# Patient Record
Sex: Female | Born: 1950
Health system: Southern US, Community
[De-identification: ages and names within clinical notes are randomized; demographics above are authoritative.]

## PROBLEM LIST (undated history)

## (undated) DIAGNOSIS — M1990 Unspecified osteoarthritis, unspecified site: Secondary | ICD-10-CM

## (undated) DIAGNOSIS — M2550 Pain in unspecified joint: Secondary | ICD-10-CM

## (undated) DIAGNOSIS — Z8719 Personal history of other diseases of the digestive system: Secondary | ICD-10-CM

## (undated) DIAGNOSIS — R51 Headache: Secondary | ICD-10-CM

## (undated) DIAGNOSIS — T7840XA Allergy, unspecified, initial encounter: Secondary | ICD-10-CM

## (undated) DIAGNOSIS — J302 Other seasonal allergic rhinitis: Secondary | ICD-10-CM

## (undated) DIAGNOSIS — E785 Hyperlipidemia, unspecified: Secondary | ICD-10-CM

## (undated) DIAGNOSIS — K219 Gastro-esophageal reflux disease without esophagitis: Secondary | ICD-10-CM

## (undated) DIAGNOSIS — B019 Varicella without complication: Secondary | ICD-10-CM

## (undated) DIAGNOSIS — T8859XA Other complications of anesthesia, initial encounter: Secondary | ICD-10-CM

## (undated) DIAGNOSIS — T4145XA Adverse effect of unspecified anesthetic, initial encounter: Secondary | ICD-10-CM

## (undated) DIAGNOSIS — M19011 Primary osteoarthritis, right shoulder: Secondary | ICD-10-CM

## (undated) HISTORY — DX: Unspecified osteoarthritis, unspecified site: M19.90

## (undated) HISTORY — DX: Varicella without complication: B01.9

## (undated) HISTORY — DX: Pain in unspecified joint: M25.50

## (undated) HISTORY — DX: Gastro-esophageal reflux disease without esophagitis: K21.9

## (undated) HISTORY — PX: LIPOSUCTION: SHX10

## (undated) HISTORY — DX: Allergy, unspecified, initial encounter: T78.40XA

## (undated) HISTORY — DX: Hyperlipidemia, unspecified: E78.5

## (undated) HISTORY — PX: OTHER SURGICAL HISTORY: SHX169

---

## 1979-12-29 HISTORY — PX: OTHER SURGICAL HISTORY: SHX169

## 2000-09-21 ENCOUNTER — Other Ambulatory Visit: Admission: RE | Admit: 2000-09-21 | Discharge: 2000-09-21 | Payer: Self-pay | Admitting: Obstetrics and Gynecology

## 2001-09-27 ENCOUNTER — Other Ambulatory Visit: Admission: RE | Admit: 2001-09-27 | Discharge: 2001-09-27 | Payer: Self-pay | Admitting: Obstetrics and Gynecology

## 2001-10-10 ENCOUNTER — Encounter: Payer: Self-pay | Admitting: Obstetrics and Gynecology

## 2001-10-10 ENCOUNTER — Encounter: Admission: RE | Admit: 2001-10-10 | Discharge: 2001-10-10 | Payer: Self-pay | Admitting: Obstetrics and Gynecology

## 2003-03-01 ENCOUNTER — Other Ambulatory Visit: Admission: RE | Admit: 2003-03-01 | Discharge: 2003-03-01 | Payer: Self-pay | Admitting: Family Medicine

## 2003-10-03 ENCOUNTER — Encounter: Payer: Self-pay | Admitting: Family Medicine

## 2003-10-03 ENCOUNTER — Encounter: Admission: RE | Admit: 2003-10-03 | Discharge: 2003-10-03 | Payer: Self-pay | Admitting: Family Medicine

## 2003-10-08 ENCOUNTER — Encounter: Payer: Self-pay | Admitting: Family Medicine

## 2003-10-08 ENCOUNTER — Encounter: Admission: RE | Admit: 2003-10-08 | Discharge: 2003-10-08 | Payer: Self-pay | Admitting: Family Medicine

## 2004-10-16 ENCOUNTER — Other Ambulatory Visit: Admission: RE | Admit: 2004-10-16 | Discharge: 2004-10-16 | Payer: Self-pay | Admitting: Family Medicine

## 2005-12-28 HISTORY — PX: LIPOSUCTION: SHX10

## 2006-02-15 ENCOUNTER — Other Ambulatory Visit: Admission: RE | Admit: 2006-02-15 | Discharge: 2006-02-15 | Payer: Self-pay | Admitting: Family Medicine

## 2006-03-30 ENCOUNTER — Encounter: Admission: RE | Admit: 2006-03-30 | Discharge: 2006-03-30 | Payer: Self-pay | Admitting: Family Medicine

## 2007-04-18 ENCOUNTER — Other Ambulatory Visit: Admission: RE | Admit: 2007-04-18 | Discharge: 2007-04-18 | Payer: Self-pay | Admitting: Family Medicine

## 2010-03-18 ENCOUNTER — Other Ambulatory Visit: Admission: RE | Admit: 2010-03-18 | Discharge: 2010-03-18 | Payer: Self-pay | Admitting: *Deleted

## 2012-11-14 ENCOUNTER — Other Ambulatory Visit: Payer: Self-pay | Admitting: Orthopedic Surgery

## 2012-11-21 ENCOUNTER — Other Ambulatory Visit (HOSPITAL_COMMUNITY): Payer: Self-pay

## 2012-12-12 ENCOUNTER — Ambulatory Visit (HOSPITAL_COMMUNITY)
Admission: RE | Admit: 2012-12-12 | Discharge: 2012-12-12 | Disposition: A | Discharge status: Home / Self Care | Payer: BC Managed Care – PPO | Source: Ambulatory Visit | Attending: Orthopedic Surgery | Admitting: Orthopedic Surgery

## 2012-12-12 ENCOUNTER — Encounter (HOSPITAL_COMMUNITY): Payer: Self-pay

## 2012-12-12 ENCOUNTER — Encounter (HOSPITAL_COMMUNITY)
Admission: RE | Admit: 2012-12-12 | Discharge: 2012-12-12 | Disposition: A | Discharge status: Home / Self Care | Payer: BC Managed Care – PPO | Source: Ambulatory Visit | Attending: Orthopedic Surgery | Admitting: Orthopedic Surgery

## 2012-12-12 DIAGNOSIS — M19019 Primary osteoarthritis, unspecified shoulder: Secondary | ICD-10-CM | POA: Insufficient documentation

## 2012-12-12 DIAGNOSIS — Z01810 Encounter for preprocedural cardiovascular examination: Secondary | ICD-10-CM | POA: Insufficient documentation

## 2012-12-12 DIAGNOSIS — Z01818 Encounter for other preprocedural examination: Secondary | ICD-10-CM | POA: Insufficient documentation

## 2012-12-12 HISTORY — DX: Headache: R51

## 2012-12-12 HISTORY — DX: Unspecified osteoarthritis, unspecified site: M19.90

## 2012-12-12 HISTORY — DX: Other seasonal allergic rhinitis: J30.2

## 2012-12-12 HISTORY — DX: Other complications of anesthesia, initial encounter: T88.59XA

## 2012-12-12 HISTORY — DX: Adverse effect of unspecified anesthetic, initial encounter: T41.45XA

## 2012-12-12 LAB — BASIC METABOLIC PANEL
CO2: 27 mEq/L (ref 19–32)
Calcium: 9.6 mg/dL (ref 8.4–10.5)
Creatinine, Ser: 0.84 mg/dL (ref 0.50–1.10)
GFR calc Af Amer: 86 mL/min — ABNORMAL LOW (ref >90)

## 2012-12-12 LAB — CBC
MCHC: 33 g/dL (ref 30.0–36.0)
MCV: 87.4 fL (ref 78.0–100.0)
Platelets: 181 10*3/uL (ref 150–400)
RDW: 14.6 % (ref 11.5–15.5)
WBC: 7.3 10*3/uL (ref 4.0–10.5)

## 2012-12-12 LAB — URINALYSIS, ROUTINE W REFLEX MICROSCOPIC
Glucose, UA: NEGATIVE mg/dL
Hgb urine dipstick: NEGATIVE
Protein, ur: NEGATIVE mg/dL
pH: 7 (ref 5.0–8.0)

## 2012-12-12 LAB — SURGICAL PCR SCREEN
MRSA, PCR: NEGATIVE
Staphylococcus aureus: NEGATIVE

## 2012-12-12 LAB — TYPE AND SCREEN: ABO/RH(D): B POS

## 2012-12-12 LAB — ABO/RH: ABO/RH(D): B POS

## 2012-12-12 NOTE — Pre-Procedure Instructions (Signed)
20 GOLDA ZAVALZA  12/12/2012   Your procedure is scheduled on:  Monday, December 23rd.  Report to [facility] at 1100 AM.  Call this number if you have problems the morning of surgery: [phone]   Remember: Nothing to eat or drink after Midnight.      Take these medicines the morning of surgery with A SIP OF WATER: Flexeril.  Flovent if needed and bring to the hospital.   Do not wear jewelry, make-up or nail polish.  Do not wear lotions, powders, or perfumes. You may wear deodorant.  Do not shave 48 hours prior to surgery. Men may shave face and neck.  Do not bring valuables to the hospital.  Contacts, dentures or bridgework may not be worn into surgery.  Leave suitcase in the car. After surgery it may be brought to your room.  For patients admitted to the hospital, checkout time is 11:00 AM the day of discharge.   Patients discharged the day of surgery will not be allowed to drive home.  Name and phone number of your driver: __________________    Special Instructions: Shower using CHG 2 nights before surgery and the night before surgery.  If you shower the day of surgery use CHG.  Use special wash - you have one bottle of CHG for all showers.  You should use approximately 1/3 of the bottle for each shower.   Please read over the following fact sheets that you were given: Pain Booklet, Coughing and Deep Breathing, Blood Transfusion Information and Surgical Site Infection Prevention

## 2012-12-18 MED ORDER — CEFAZOLIN SODIUM-DEXTROSE 2-3 GM-% IV SOLR
2.0000 g | INTRAVENOUS | Status: AC
  Administered 2012-12-19: 2 g via INTRAVENOUS
  Filled 2012-12-18: qty 50

## 2012-12-19 ENCOUNTER — Ambulatory Visit (HOSPITAL_COMMUNITY): Payer: BC Managed Care – PPO | Admitting: Anesthesiology

## 2012-12-19 ENCOUNTER — Encounter (HOSPITAL_COMMUNITY)
Admission: RE | Disposition: A | Discharge status: Home / Self Care | Payer: Self-pay | Source: Ambulatory Visit | Attending: Orthopedic Surgery

## 2012-12-19 ENCOUNTER — Encounter (HOSPITAL_COMMUNITY): Payer: Self-pay | Admitting: Orthopedic Surgery

## 2012-12-19 ENCOUNTER — Encounter (HOSPITAL_COMMUNITY): Payer: Self-pay | Admitting: Surgery

## 2012-12-19 ENCOUNTER — Inpatient Hospital Stay (HOSPITAL_COMMUNITY)
Admission: RE | Admit: 2012-12-19 | Discharge: 2012-12-20 | DRG: 491 | Disposition: A | Discharge status: Home / Self Care | Payer: BC Managed Care – PPO | Source: Ambulatory Visit | Attending: Orthopedic Surgery | Admitting: Orthopedic Surgery

## 2012-12-19 ENCOUNTER — Inpatient Hospital Stay (HOSPITAL_COMMUNITY): Payer: BC Managed Care – PPO

## 2012-12-19 ENCOUNTER — Encounter (HOSPITAL_COMMUNITY): Payer: Self-pay | Admitting: Anesthesiology

## 2012-12-19 DIAGNOSIS — M19011 Primary osteoarthritis, right shoulder: Secondary | ICD-10-CM

## 2012-12-19 DIAGNOSIS — M19019 Primary osteoarthritis, unspecified shoulder: Principal | ICD-10-CM | POA: Diagnosis present

## 2012-12-19 DIAGNOSIS — Z79899 Other long term (current) drug therapy: Secondary | ICD-10-CM

## 2012-12-19 HISTORY — DX: Primary osteoarthritis, right shoulder: M19.011

## 2012-12-19 HISTORY — PX: TOTAL SHOULDER ARTHROPLASTY: SHX126

## 2012-12-19 SURGERY — ARTHROPLASTY, SHOULDER, TOTAL
Anesthesia: General | Site: Shoulder | Laterality: Right | Wound class: Clean

## 2012-12-19 MED ORDER — PROMETHAZINE HCL 25 MG/ML IJ SOLN
6.2500 mg | Freq: Once | INTRAMUSCULAR | Status: AC
  Administered 2012-12-19: 6.25 mg via INTRAVENOUS

## 2012-12-19 MED ORDER — ACETAMINOPHEN 650 MG RE SUPP: 650.0000 mg | Freq: Four times a day (QID) | RECTAL | Status: DC | PRN

## 2012-12-19 MED ORDER — VENLAFAXINE HCL 75 MG PO TABS
150.0000 mg | ORAL_TABLET | Freq: Every day | ORAL | Status: DC
  Administered 2012-12-20: 150 mg via ORAL
  Filled 2012-12-19 (×2): qty 2

## 2012-12-19 MED ORDER — PHENYLEPHRINE HCL 10 MG/ML IJ SOLN
10.0000 mg | INTRAVENOUS | Status: DC | PRN
  Administered 2012-12-19: 10 ug/min via INTRAVENOUS

## 2012-12-19 MED ORDER — PROMETHAZINE HCL 25 MG/ML IJ SOLN: 12.5000 mg | Freq: Once | INTRAMUSCULAR | Status: DC

## 2012-12-19 MED ORDER — ZOLPIDEM TARTRATE 5 MG PO TABS: 5.0000 mg | ORAL_TABLET | Freq: Every evening | ORAL | Status: DC | PRN

## 2012-12-19 MED ORDER — VENLAFAXINE HCL 75 MG PO TABS: 75.0000 mg | ORAL_TABLET | Freq: Two times a day (BID) | ORAL | Status: DC

## 2012-12-19 MED ORDER — DEXAMETHASONE SODIUM PHOSPHATE 4 MG/ML IJ SOLN
INTRAMUSCULAR | Status: DC | PRN
  Administered 2012-12-19: 4 mg via INTRAVENOUS

## 2012-12-19 MED ORDER — SUMATRIPTAN SUCCINATE 100 MG PO TABS
100.0000 mg | ORAL_TABLET | ORAL | Status: DC | PRN
  Filled 2012-12-19: qty 1

## 2012-12-19 MED ORDER — SENNA 8.6 MG PO TABS
1.0000 | ORAL_TABLET | Freq: Two times a day (BID) | ORAL | Status: DC
  Administered 2012-12-19: 8.6 mg via ORAL
  Filled 2012-12-19 (×3): qty 1

## 2012-12-19 MED ORDER — LACTATED RINGERS IV SOLN
INTRAVENOUS | Status: DC | PRN
  Administered 2012-12-19: 12:00:00 via INTRAVENOUS

## 2012-12-19 MED ORDER — VECURONIUM BROMIDE 10 MG IV SOLR
INTRAVENOUS | Status: DC | PRN
  Administered 2012-12-19: 1.5 mg via INTRAVENOUS
  Administered 2012-12-19: 1 mg via INTRAVENOUS

## 2012-12-19 MED ORDER — MEPERIDINE HCL 25 MG/ML IJ SOLN: 6.2500 mg | INTRAMUSCULAR | Status: DC | PRN

## 2012-12-19 MED ORDER — MENTHOL 3 MG MT LOZG: 1.0000 | LOZENGE | OROMUCOSAL | Status: DC | PRN

## 2012-12-19 MED ORDER — VENLAFAXINE HCL 75 MG PO TABS
75.0000 mg | ORAL_TABLET | Freq: Every day | ORAL | Status: DC
  Administered 2012-12-19: 75 mg via ORAL
  Filled 2012-12-19 (×2): qty 1

## 2012-12-19 MED ORDER — ALUM & MAG HYDROXIDE-SIMETH 200-200-20 MG/5ML PO SUSP: 30.0000 mL | ORAL | Status: DC | PRN

## 2012-12-19 MED ORDER — ONDANSETRON HCL 4 MG/2ML IJ SOLN
INTRAMUSCULAR | Status: DC | PRN
  Administered 2012-12-19: 4 mg via INTRAVENOUS

## 2012-12-19 MED ORDER — DOCUSATE SODIUM 100 MG PO CAPS
100.0000 mg | ORAL_CAPSULE | Freq: Two times a day (BID) | ORAL | Status: DC
  Administered 2012-12-19: 100 mg via ORAL
  Filled 2012-12-19 (×2): qty 1

## 2012-12-19 MED ORDER — DEXTROSE 5 % IV SOLN
500.0000 mg | Freq: Four times a day (QID) | INTRAVENOUS | Status: DC | PRN
  Filled 2012-12-19: qty 5

## 2012-12-19 MED ORDER — TOPIRAMATE 100 MG PO TABS
100.0000 mg | ORAL_TABLET | Freq: Every evening | ORAL | Status: DC
  Administered 2012-12-19: 100 mg via ORAL
  Filled 2012-12-19 (×2): qty 1

## 2012-12-19 MED ORDER — DIPHENHYDRAMINE HCL 12.5 MG/5ML PO ELIX: 12.5000 mg | ORAL_SOLUTION | ORAL | Status: DC | PRN

## 2012-12-19 MED ORDER — METOCLOPRAMIDE HCL 10 MG PO TABS: 5.0000 mg | ORAL_TABLET | Freq: Three times a day (TID) | ORAL | Status: DC | PRN

## 2012-12-19 MED ORDER — FENTANYL CITRATE 0.05 MG/ML IJ SOLN
INTRAMUSCULAR | Status: DC | PRN
  Administered 2012-12-19 (×3): 50 ug via INTRAVENOUS
  Administered 2012-12-19: 100 ug via INTRAVENOUS
  Administered 2012-12-19: 50 ug via INTRAVENOUS

## 2012-12-19 MED ORDER — PHENOL 1.4 % MT LIQD: 1.0000 | OROMUCOSAL | Status: DC | PRN

## 2012-12-19 MED ORDER — ONDANSETRON HCL 4 MG/2ML IJ SOLN: 4.0000 mg | Freq: Once | INTRAMUSCULAR | Status: DC | PRN

## 2012-12-19 MED ORDER — SODIUM CHLORIDE 0.9 % IR SOLN
Status: DC | PRN
  Administered 2012-12-19: 1000 mL

## 2012-12-19 MED ORDER — ROCURONIUM BROMIDE 100 MG/10ML IV SOLN
INTRAVENOUS | Status: DC | PRN
  Administered 2012-12-19: 50 mg via INTRAVENOUS

## 2012-12-19 MED ORDER — METOCLOPRAMIDE HCL 5 MG/ML IJ SOLN: 5.0000 mg | Freq: Three times a day (TID) | INTRAMUSCULAR | Status: DC | PRN

## 2012-12-19 MED ORDER — ROPIVACAINE HCL 5 MG/ML IJ SOLN
INTRAMUSCULAR | Status: DC | PRN
  Administered 2012-12-19: 30 mL via EPIDURAL

## 2012-12-19 MED ORDER — METOCLOPRAMIDE HCL 10 MG PO TABS: 10.0000 mg | ORAL_TABLET | Freq: Two times a day (BID) | ORAL | Status: DC | PRN

## 2012-12-19 MED ORDER — ACETAMINOPHEN 325 MG PO TABS: 650.0000 mg | ORAL_TABLET | Freq: Four times a day (QID) | ORAL | Status: DC | PRN

## 2012-12-19 MED ORDER — ARTIFICIAL TEARS OP OINT
TOPICAL_OINTMENT | OPHTHALMIC | Status: DC | PRN
  Administered 2012-12-19: 1 via OPHTHALMIC

## 2012-12-19 MED ORDER — FLUTICASONE PROPIONATE HFA 110 MCG/ACT IN AERO
1.0000 | INHALATION_SPRAY | Freq: Every day | RESPIRATORY_TRACT | Status: DC | PRN
Start: 2012-12-19 — End: 2012-12-20

## 2012-12-19 MED ORDER — PROMETHAZINE HCL 25 MG PO TABS: 25.0000 mg | ORAL_TABLET | Freq: Four times a day (QID) | ORAL | Status: DC | PRN

## 2012-12-19 MED ORDER — PROPOFOL 10 MG/ML IV BOLUS
INTRAVENOUS | Status: DC | PRN
  Administered 2012-12-19: 130 mg via INTRAVENOUS

## 2012-12-19 MED ORDER — NEOSTIGMINE METHYLSULFATE 1 MG/ML IJ SOLN
INTRAMUSCULAR | Status: DC | PRN
  Administered 2012-12-19: 4 mg via INTRAVENOUS

## 2012-12-19 MED ORDER — OXYCODONE HCL 5 MG PO TABS
5.0000 mg | ORAL_TABLET | ORAL | Status: DC | PRN
  Administered 2012-12-19: 5 mg via ORAL
  Administered 2012-12-20 (×2): 10 mg via ORAL
  Filled 2012-12-19 (×2): qty 2
  Filled 2012-12-19: qty 1

## 2012-12-19 MED ORDER — MIDAZOLAM HCL 5 MG/5ML IJ SOLN
INTRAMUSCULAR | Status: DC | PRN
  Administered 2012-12-19 (×2): 1 mg via INTRAVENOUS

## 2012-12-19 MED ORDER — OXYCODONE HCL 5 MG/5ML PO SOLN: 5.0000 mg | Freq: Once | ORAL | Status: DC | PRN

## 2012-12-19 MED ORDER — LACTATED RINGERS IV SOLN
INTRAVENOUS | Status: DC
  Administered 2012-12-19: 12:00:00 via INTRAVENOUS

## 2012-12-19 MED ORDER — OXYCODONE-ACETAMINOPHEN 5-325 MG PO TABS
1.0000 | ORAL_TABLET | ORAL | Status: DC | PRN
  Administered 2012-12-20: 2 via ORAL
  Administered 2012-12-20: 1 via ORAL
  Administered 2012-12-20: 2 via ORAL
  Filled 2012-12-19: qty 1
  Filled 2012-12-19: qty 2
  Filled 2012-12-19: qty 1
  Filled 2012-12-19: qty 2

## 2012-12-19 MED ORDER — HYDROMORPHONE HCL PF 1 MG/ML IJ SOLN: 0.2500 mg | INTRAMUSCULAR | Status: DC | PRN

## 2012-12-19 MED ORDER — ONDANSETRON HCL 4 MG PO TABS: 4.0000 mg | ORAL_TABLET | Freq: Four times a day (QID) | ORAL | Status: DC | PRN

## 2012-12-19 MED ORDER — CEFAZOLIN SODIUM-DEXTROSE 2-3 GM-% IV SOLR
2.0000 g | Freq: Four times a day (QID) | INTRAVENOUS | Status: AC
  Administered 2012-12-19 – 2012-12-20 (×3): 2 g via INTRAVENOUS
  Filled 2012-12-19 (×4): qty 50

## 2012-12-19 MED ORDER — FLUTICASONE PROPIONATE 50 MCG/ACT NA SUSP
2.0000 | Freq: Every day | NASAL | Status: DC
  Filled 2012-12-19: qty 16

## 2012-12-19 MED ORDER — POTASSIUM CHLORIDE IN NACL 20-0.45 MEQ/L-% IV SOLN
INTRAVENOUS | Status: DC
  Administered 2012-12-20: 01:00:00 via INTRAVENOUS
  Filled 2012-12-19 (×3): qty 1000

## 2012-12-19 MED ORDER — PHENYLEPHRINE HCL 10 MG/ML IJ SOLN
INTRAMUSCULAR | Status: DC | PRN
  Administered 2012-12-19 (×3): 40 ug via INTRAVENOUS

## 2012-12-19 MED ORDER — PROMETHAZINE HCL 25 MG/ML IJ SOLN
INTRAMUSCULAR | Status: AC
  Filled 2012-12-19: qty 1

## 2012-12-19 MED ORDER — HYDROMORPHONE HCL PF 1 MG/ML IJ SOLN
0.5000 mg | INTRAMUSCULAR | Status: DC | PRN
  Administered 2012-12-20: 1 mg via INTRAVENOUS
  Filled 2012-12-19: qty 1

## 2012-12-19 MED ORDER — LIDOCAINE HCL (CARDIAC) 20 MG/ML IV SOLN
INTRAVENOUS | Status: DC | PRN
  Administered 2012-12-19: 100 mg via INTRAVENOUS

## 2012-12-19 MED ORDER — METHOCARBAMOL 500 MG PO TABS
500.0000 mg | ORAL_TABLET | Freq: Four times a day (QID) | ORAL | Status: DC | PRN
  Administered 2012-12-20 (×2): 500 mg via ORAL
  Filled 2012-12-19 (×3): qty 1

## 2012-12-19 MED ORDER — OXYCODONE-ACETAMINOPHEN 10-325 MG PO TABS: 1.0000 | ORAL_TABLET | Freq: Four times a day (QID) | ORAL | Status: DC | PRN

## 2012-12-19 MED ORDER — PROMETHAZINE HCL 25 MG/ML IJ SOLN: 6.2500 mg | Freq: Once | INTRAMUSCULAR | Status: DC

## 2012-12-19 MED ORDER — CYCLOBENZAPRINE HCL 10 MG PO TABS
20.0000 mg | ORAL_TABLET | Freq: Every evening | ORAL | Status: DC
Start: 2012-12-19 — End: 2012-12-20
  Administered 2012-12-19: 20 mg via ORAL
  Filled 2012-12-19 (×2): qty 2

## 2012-12-19 MED ORDER — ONDANSETRON HCL 4 MG/2ML IJ SOLN: 4.0000 mg | Freq: Four times a day (QID) | INTRAMUSCULAR | Status: DC | PRN

## 2012-12-19 MED ORDER — SORBITOL 70 % SOLN: 30.0000 mL | Freq: Every day | Status: DC | PRN

## 2012-12-19 MED ORDER — SENNA-DOCUSATE SODIUM 8.6-50 MG PO TABS: 1.0000 | ORAL_TABLET | Freq: Every day | ORAL | Status: DC

## 2012-12-19 MED ORDER — OXYCODONE HCL 5 MG PO TABS: 5.0000 mg | ORAL_TABLET | Freq: Once | ORAL | Status: DC | PRN

## 2012-12-19 MED ORDER — POLYETHYLENE GLYCOL 3350 17 G PO PACK: 17.0000 g | PACK | Freq: Every day | ORAL | Status: DC | PRN

## 2012-12-19 MED ORDER — GLYCOPYRROLATE 0.2 MG/ML IJ SOLN
INTRAMUSCULAR | Status: DC | PRN
  Administered 2012-12-19: 0.6 mg via INTRAVENOUS

## 2012-12-19 SURGICAL SUPPLY — 66 items
BENZOIN TINCTURE PRP APPL 2/3 (GAUZE/BANDAGES/DRESSINGS) ×2 IMPLANT
BIT DRILL QUICK RELEASE PRPHRL (DRILL) ×3 IMPLANT
BLADE SAW SAG 29X58X.64 (BLADE) ×2 IMPLANT
BOOTCOVER CLEANROOM LRG (PROTECTIVE WEAR) ×4 IMPLANT
BOWL SMART MIX CTS (DISPOSABLE) IMPLANT
BRUSH FEMORAL CANAL (MISCELLANEOUS) IMPLANT
CEMENT BONE DEPUY (Cement) ×2 IMPLANT
CLOTH BEACON ORANGE TIMEOUT ST (SAFETY) ×2 IMPLANT
CLSR STERI-STRIP ANTIMIC 1/2X4 (GAUZE/BANDAGES/DRESSINGS) ×2 IMPLANT
COVER SURGICAL LIGHT HANDLE (MISCELLANEOUS) ×2 IMPLANT
COVER TABLE BACK 60X90 (DRAPES) IMPLANT
DRAPE C-ARM 42X72 X-RAY (DRAPES) IMPLANT
DRAPE INCISE IOBAN 66X45 STRL (DRAPES) ×2 IMPLANT
DRAPE U-SHAPE 47X51 STRL (DRAPES) ×2 IMPLANT
DRILL QUICK RELEASE PERIPHERAL (DRILL) ×6
DRSG MEPILEX BORDER 4X8 (GAUZE/BANDAGES/DRESSINGS) ×4 IMPLANT
DRSG PAD ABDOMINAL 8X10 ST (GAUZE/BANDAGES/DRESSINGS) ×2 IMPLANT
DURAPREP 26ML APPLICATOR (WOUND CARE) ×2 IMPLANT
ELECT BLADE 6.5 EXT (BLADE) IMPLANT
ELECT NEEDLE TIP 2.8 STRL (NEEDLE) ×2 IMPLANT
ELECT REM PT RETURN 9FT ADLT (ELECTROSURGICAL) ×2
ELECTRODE REM PT RTRN 9FT ADLT (ELECTROSURGICAL) ×1 IMPLANT
EVACUATOR 1/8 PVC DRAIN (DRAIN) IMPLANT
FACESHIELD LNG OPTICON STERILE (SAFETY) IMPLANT
GLOVE BIOGEL PI IND STRL 8 (GLOVE) ×2 IMPLANT
GLOVE BIOGEL PI INDICATOR 8 (GLOVE) ×2
GLOVE ORTHO TXT STRL SZ7.5 (GLOVE) ×2 IMPLANT
GLOVE SURG ORTHO 8.0 STRL STRW (GLOVE) ×4 IMPLANT
GOWN PREVENTION PLUS XXLARGE (GOWN DISPOSABLE) ×2 IMPLANT
GOWN STRL REIN XL XLG (GOWN DISPOSABLE) IMPLANT
HANDPIECE INTERPULSE COAX TIP (DISPOSABLE)
HOOD PEEL AWAY FACE SHEILD DIS (HOOD) ×4 IMPLANT
KIT BASIN OR (CUSTOM PROCEDURE TRAY) ×2 IMPLANT
KIT ROOM TURNOVER OR (KITS) ×2 IMPLANT
MANIFOLD NEPTUNE II (INSTRUMENTS) ×2 IMPLANT
NEEDLE 1/2 CIR CATGUT .05X1.09 (NEEDLE) ×2 IMPLANT
NEEDLE HYPO 25GX1X1/2 BEV (NEEDLE) ×2 IMPLANT
NS IRRIG 1000ML POUR BTL (IV SOLUTION) ×2 IMPLANT
PACK SHOULDER (CUSTOM PROCEDURE TRAY) ×2 IMPLANT
PAD ARMBOARD 7.5X6 YLW CONV (MISCELLANEOUS) ×4 IMPLANT
PIN STEINMANN THREADED TIP (PIN) ×2 IMPLANT
PIN THREADED REVERSE (PIN) ×2 IMPLANT
RETRIEVER SUT HEWSON (MISCELLANEOUS) IMPLANT
SET HNDPC FAN SPRY TIP SCT (DISPOSABLE) IMPLANT
SLING ARM IMMOBILIZER LRG (SOFTGOODS) ×4 IMPLANT
SLING ARM IMMOBILIZER MED (SOFTGOODS) IMPLANT
SMARTMIX MINI TOWER (MISCELLANEOUS)
SPONGE GAUZE 4X4 12PLY (GAUZE/BANDAGES/DRESSINGS) ×2 IMPLANT
SPONGE LAP 18X18 X RAY DECT (DISPOSABLE) ×2 IMPLANT
STRIP CLOSURE SKIN 1/2X4 (GAUZE/BANDAGES/DRESSINGS) ×2 IMPLANT
SUCTION FRAZIER TIP 10 FR DISP (SUCTIONS) ×2 IMPLANT
SUPPORT WRAP ARM LG (MISCELLANEOUS) ×2 IMPLANT
SUT FIBERWIRE #2 38 REV NDL BL (SUTURE) ×14
SUT MNCRL AB 4-0 PS2 18 (SUTURE) ×2 IMPLANT
SUT VIC AB 0 CT1 27 (SUTURE) ×1
SUT VIC AB 0 CT1 27XBRD ANBCTR (SUTURE) ×1 IMPLANT
SUT VIC AB 2-0 CT1 27 (SUTURE)
SUT VIC AB 2-0 CT1 TAPERPNT 27 (SUTURE) IMPLANT
SUT VIC AB 3-0 SH 18 (SUTURE) ×2 IMPLANT
SUTURE FIBERWR#2 38 REV NDL BL (SUTURE) ×7 IMPLANT
SYR CONTROL 10ML LL (SYRINGE) ×2 IMPLANT
TOWEL OR 17X24 6PK STRL BLUE (TOWEL DISPOSABLE) ×2 IMPLANT
TOWEL OR 17X26 10 PK STRL BLUE (TOWEL DISPOSABLE) ×2 IMPLANT
TOWER SMARTMIX MINI (MISCELLANEOUS) IMPLANT
TRAY FOLEY CATH 14FR (SET/KITS/TRAYS/PACK) IMPLANT
WATER STERILE IRR 1000ML POUR (IV SOLUTION) ×2 IMPLANT

## 2012-12-19 NOTE — Anesthesia Postprocedure Evaluation (Signed)
  Anesthesia Post-op Note  Patient: Lisa Morris  Procedure(s) Performed: Procedure(s) (LRB) with comments: TOTAL SHOULDER ARTHROPLASTY (Right)  Patient Location: PACU  Anesthesia Type:GA combined with regional for post-op pain  Level of Consciousness: awake and alert   Airway and Oxygen Therapy: Patient Spontanous Breathing  Post-op Pain: none  Post-op Assessment: Post-op Vital signs reviewed, Patient's Cardiovascular Status Stable, Respiratory Function Stable, Patent Airway, No signs of Nausea or vomiting and Pain level controlled  Post-op Vital Signs: stable  Complications: No apparent anesthesia complications

## 2012-12-19 NOTE — Op Note (Signed)
12/19/2012  3:44 PM  PATIENT:  Lisa Morris    PRE-OPERATIVE DIAGNOSIS:  RIGHT SHOULDER DJD  POST-OPERATIVE DIAGNOSIS:  Same  PROCEDURE:  TOTAL SHOULDER ARTHROPLASTY  SURGEON:  Eulas Post, MD  PHYSICIAN ASSISTANT: Janace Litten, OPA-C, present and scrubbed throughout the case, critical for completion in a timely fashion, and for retraction, instrumentation, and closure.  ANESTHESIA:   General  PREOPERATIVE INDICATIONS:  Lisa Morris is a  61 y.o. female with a diagnosis of RIGHT SHOULDER DJD who failed conservative measures and elected for surgical management.    The risks benefits and alternatives were discussed with the patient preoperatively including but not limited to the risks of infection, bleeding, nerve injury, cardiopulmonary complications, the need for revision surgery, dislocation, loosening, incomplete relief of pain, among others, and the patient was willing to proceed.   OPERATIVE IMPLANTS: Biomet size 9 mini press-fit humeral stem, size 42+18 Versa-dial humeral head, set in the E position with increased coverage posteriorly and superiorly, with a small cemented glenoid polyethylene 3 peg implant with a central regenerex noncemented post.   OPERATIVE FINDINGS: Advanced glenohumeral osteoarthritis involving the glenoid and the humeral head with substantial osteophyte formation inferiorly. The glenoid itself was very small, and remarkably poor bone quality. The vault itself was fairly shallow. Even with the pin in the center position, the anteroinferior hole penetrated, and the regenerex peg hole also penetrated. The other 2 holes did not penetrate. The anterior glenoid was fairly weak.   OPERATIVE PROCEDURE: The patient was brought to the operating room and placed in the supine position. General anesthesia was administered. IV antibiotics were given.  A foley was placed. The upper extremity was prepped and draped in usual sterile fashion. The patient was in a  beachchair position with all bony prominences padded.   Time out was performed and a deltopectoral approach was carried out. The biceps tendon was tenodesed to the pectoralis tendon. The subscapularis was released, tagging it with a #2 FiberWire, leaving a cuff of tendon for repair.   The inferior osteophyte was removed, and release of the capsule off of the humeral side was completed. The head was dislocated, and I reamed sequentially. I placed the humeral cutting guide at 30 of retroversion, and then pinned this into place, and made my humeral neck cut.   I then placed deep retractors and exposed the glenoid. I excised the labrum circumferentially, taking care to protect the axillary nerve inferiorly. I did not have adequate access to the glenoid, so I went back and recut the humeral neck. This provided adequate access.  I then placed a guidewire into the center position, controlling appropriate version and inclination. I then reamed over the guidewire with the small reamer, and was satisfied with the preparation. I barely removed any bone, leaving the subchondral plate still intact. I preserved the subchondral bone in order to maximize the strength and minimize the risk for subsequent subsidence.   I then drilled the central hole for the regenerate peg, and then placed the guide, and then drilled the 3 peripheral peg holes. As indicated above, the central hole as well as the anterior hole perforated, and the anterior glenoid was very thin, and not great quality. Nonetheless I was in the center of the glenoid, and had a satisfactory, but not ideal platform.  I then cleaned the glenoid, irrigated it copiously, and then dried it and cemented the prosthesis into place. Excellent seating was achieved. I had full exposure. The cement cured, and then  I turned my attention to the humeral side.   I sequentially broached, up to the selected size, with the broach set at 30 of retroversion. I then placed the  real stem. I trialed with multiple heads, and the above-named component was selected. Increased posterior superior coverage improved the coverage. The soft tissue tension was appropriate.   I then impacted the real humeral head into place, reduced the head, and irrigated copiously. Excellent stability and range of motion was achieved. I repaired the subscapularis with 4 #2 FiberWire, as well as the rotator interval, and irrigated copiously once more. The subcutaneous tissue was closed with Vicryl including the deltopectoral fascia.   The skin was closed with Steri-Strips and sterile gauze was applied. She had a preoperative nerve block. She tolerated the procedure well and there were no complications.

## 2012-12-19 NOTE — Progress Notes (Signed)
O2 at 2/l initiated via nasal cannula due to sleepiness from nausea medication

## 2012-12-19 NOTE — Progress Notes (Signed)
Dr. Gypsy Balsam visited bedside asked if nausea had improved patient states it has improved a little

## 2012-12-19 NOTE — Transfer of Care (Signed)
Immediate Anesthesia Transfer of Care Note  Patient: Lisa Morris  Procedure(s) Performed: Procedure(s) (LRB) with comments: TOTAL SHOULDER ARTHROPLASTY (Right)  Patient Location: PACU  Anesthesia Type:General and Regional  Level of Consciousness: awake, alert  and oriented  Airway & Oxygen Therapy: Patient Spontanous Breathing and Patient connected to nasal cannula oxygen  Post-op Assessment: Report given to PACU RN, Post -op Vital signs reviewed and stable and Patient moving all extremities X 4  Post vital signs: Reviewed and stable  Complications: No apparent anesthesia complications

## 2012-12-19 NOTE — Progress Notes (Signed)
Dr. Gypsy Balsam called for sign out informed patient's B/P has been in low ninieties

## 2012-12-19 NOTE — Progress Notes (Signed)
Right shoulder xray done.

## 2012-12-19 NOTE — Progress Notes (Signed)
Dr. Gypsy Balsam called patient's nausea remains the same order received to repeat medicine

## 2012-12-19 NOTE — H&P (Signed)
PREOPERATIVE H&P  Chief Complaint: RIGHT SHOULDER DJD  HPI: Lisa Morris is a 61 y.o. female who presents for preoperative history and physical with a diagnosis of RIGHT SHOULDER DJD. Symptoms are rated as moderate to severe, and have been worsening.  This is significantly impairing activities of daily living.  She has elected for surgical management. She has failed injections, activity modification, exercises, anti-inflammatories, and pain medications.  Past Medical History  Diagnosis Date  . Seasonal allergies   . Arthritis   . Headache     Takes Maxlt  . Complication of anesthesia    Past Surgical History  Procedure Date  . Liposuction     Neck  . [facility]    History   Social History  . Marital Status: Married    Spouse Name: N/A    Number of Children: N/A  . Years of Education: N/A   Social History Main Topics  . Smoking status: Never Smoker   . Smokeless tobacco: None  . Alcohol Use: 1.8 oz/week    3 Glasses of wine per week  . Drug Use: No  . Sexually Active:    Other Topics Concern  . None   Social History Narrative  . None   History reviewed. No pertinent family history. No Known Allergies Prior to Admission medications   Medication Sig Start Date End Date Taking? Authorizing Provider  cyclobenzaprine (FLEXERIL) 10 MG tablet Take 20 mg by mouth every evening.   Yes Historical Provider, MD  fluticasone (FLOVENT HFA) 110 MCG/ACT inhaler Inhale 1-2 puffs into the lungs daily as needed. For cough   Yes Historical Provider, MD  HYDROcodone-acetaminophen (NORCO) 7.5-325 MG per tablet Take 1 tablet by mouth daily as needed. For pain   Yes Historical Provider, MD  meloxicam (MOBIC) 15 MG tablet Take 15 mg by mouth every evening.   Yes Historical Provider, MD  mometasone (NASONEX) 50 MCG/ACT nasal spray Place 2 sprays into the nose daily as needed. For congestion   Yes Historical Provider, MD  topiramate (TOPAMAX) 100 MG tablet Take 100 mg by mouth every  evening.   Yes Historical Provider, MD  venlafaxine (EFFEXOR) 75 MG tablet Take 75-150 mg by mouth 2 (two) times daily. Takes 2 tablets in the morning and 1 tablet in the evening   Yes Historical Provider, MD  diclofenac sodium (VOLTAREN) 1 % GEL Apply 2 g topically daily as needed. For shoulder pain    Historical Provider, MD  metoCLOPramide (REGLAN) 10 MG tablet Take 10 mg by mouth 2 (two) times daily as needed. For nausea and vomiting    Historical Provider, MD  rizatriptan (MAXALT) 10 MG tablet Take 10 mg by mouth as needed. May repeat in 2 hours if needed For migraines    Historical Provider, MD     Positive ROS: All other systems have been reviewed and were otherwise negative with the exception of those mentioned in the HPI and as above.  Physical Exam: General: Alert, no acute distress Cardiovascular: No pedal edema Respiratory: No cyanosis, no use of accessory musculature GI: No organomegaly, abdomen is soft and non-tender Skin: No lesions in the area of chief complaint Neurologic: Sensation intact distally Psychiatric: Patient is competent for consent with normal mood and affect Lymphatic: No axillary or cervical lymphadenopathy  MUSCULOSKELETAL: Right shoulder active forward flexion is 0-100, external rotation to neutral, mild crepitance, positive pain.  Assessment: RIGHT SHOULDER DJD  Plan: Plan for Procedure(s): TOTAL SHOULDER ARTHROPLASTY  The risks benefits and alternatives  were discussed with the patient including but not limited to the risks of nonoperative treatment, versus surgical intervention including infection, bleeding, nerve injury,  blood clots, cardiopulmonary complications, morbidity, mortality, among others, and they were willing to proceed. We've also discussed the risks for the need for revision surgery, incomplete relief of pain, regional pain syndrome, among others.  Eulas Post, MD Cell [phone] Pager [phone]  12/19/2012 12:57  PM

## 2012-12-19 NOTE — Preoperative (Signed)
Beta Blockers   Reason not to administer Beta Blockers: not prescribed 

## 2012-12-19 NOTE — Anesthesia Preprocedure Evaluation (Addendum)
Anesthesia Evaluation  Patient identified by MRN, date of birth, ID band Patient awake    Reviewed: Allergy & Precautions, H&P , NPO status , Patient's Chart, lab work & pertinent test results  History of Anesthesia Complications (+) PONV  Airway Mallampati: II TM Distance: >3 FB Neck ROM: Full    Dental  (+) Teeth Intact and Dental Advisory Given   Pulmonary  Uses inhaler sporadically; as needed for seasonal allergies         Cardiovascular negative cardio ROS      Neuro/Psych  Headaches,    GI/Hepatic negative GI ROS, Neg liver ROS,   Endo/Other  negative endocrine ROS  Renal/GU negative Renal ROS     Musculoskeletal   Abdominal   Peds  Hematology negative hematology ROS (+)   Anesthesia Other Findings   Reproductive/Obstetrics                          Anesthesia Physical Anesthesia Plan  ASA: II  Anesthesia Plan: General   Post-op Pain Management:    Induction: Intravenous  Airway Management Planned: Oral ETT  Additional Equipment:   Intra-op Plan:   Post-operative Plan: Extubation in OR  Informed Consent: I have reviewed the patients History and Physical, chart, labs and discussed the procedure including the risks, benefits and alternatives for the proposed anesthesia with the patient or authorized representative who has indicated his/her understanding and acceptance.   Dental advisory given  Plan Discussed with: Surgeon and CRNA  Anesthesia Plan Comments:        Anesthesia Quick Evaluation

## 2012-12-19 NOTE — Anesthesia Procedure Notes (Addendum)
Anesthesia Regional Block:  Interscalene brachial plexus block  Pre-Anesthetic Checklist: ,, timeout performed, Correct Patient, Correct Site, Correct Laterality, Correct Procedure, Correct Position, site marked, Risks and benefits discussed,  Surgical consent,  Pre-op evaluation,  At surgeon's request and post-op pain management  Laterality: Right  Prep: chloraprep       Needles:  Injection technique: Single-shot  Needle Type: Echogenic Stimulator Needle     Needle Length: 5cm 5 cm     Additional Needles:  Procedures: ultrasound guided (picture in chart) and nerve stimulator Interscalene brachial plexus block  Nerve Stimulator or Paresthesia:  Response: 0.4 mA,   Additional Responses:   Narrative:  Start time: 12/19/2012 12:25 PM End time: 12/19/2012 12:40 PM Injection made incrementally with aspirations every 5 mL.  Performed by: Personally  Anesthesiologist: Arta Bruce MD  Additional Notes: Monitors applied. Patient sedated. Sterile prep and drape,hand hygiene and sterile gloves were used. Relevant anatomy identified.Needle position confirmed.Local anesthetic injected incrementally after negative aspiration. Local anesthetic spread visualized around nerve(s). Vascular puncture avoided. No complications. Image printed for medical record.The patient tolerated the procedure well.       Interscalene brachial plexus block Procedure Name: Intubation Date/Time: 12/19/2012 1:14 PM Performed by: Gayla Medicus Pre-anesthesia Checklist: Patient identified, Timeout performed, Emergency Drugs available, Suction available and Patient being monitored Patient Re-evaluated:Patient Re-evaluated prior to inductionOxygen Delivery Method: Circle system utilized Preoxygenation: Pre-oxygenation with 100% oxygen Intubation Type: IV induction Ventilation: Mask ventilation without difficulty Laryngoscope Size: Mac and 3 Grade View: Grade II Tube type: Oral Tube size: 7.5 mm Number  of attempts: 1 Airway Equipment and Method: Stylet Placement Confirmation: ETT inserted through vocal cords under direct vision,  positive ETCO2 and breath sounds checked- equal and bilateral Secured at: 22 cm Tube secured with: Tape Dental Injury: Teeth and Oropharynx as per pre-operative assessment

## 2012-12-20 LAB — CBC
Hemoglobin: 11.2 g/dL — ABNORMAL LOW (ref 12.0–15.0)
MCH: 28.4 pg (ref 26.0–34.0)
MCHC: 32.3 g/dL (ref 30.0–36.0)
RDW: 15 % (ref 11.5–15.5)

## 2012-12-20 LAB — BASIC METABOLIC PANEL
BUN: 13 mg/dL (ref 6–23)
Calcium: 8.6 mg/dL (ref 8.4–10.5)
GFR calc non Af Amer: >90 mL/min (ref >90)
Glucose, Bld: 105 mg/dL — ABNORMAL HIGH (ref 70–99)
Sodium: 136 mEq/L (ref 135–145)

## 2012-12-20 NOTE — Progress Notes (Signed)
Patient ID: Lisa Morris, female   DOB: Dec 31, 1950, 61 y.o.   MRN: [id number]     Subjective:  Patient reports pain as mild to moderate.  She states that she has had the worst pain ever last night and has been unable to sleep   Objective:   VITALS:   Filed Vitals:   12/19/12 1745 12/19/12 1814 12/19/12 2229 12/20/12 0445  BP: 92/54 115/65 109/60 110/59  Pulse: 84 90 67 70  Temp: 98.7 F (37.1 C) 98.2 F (36.8 C) 98.2 F (36.8 C) 98 F (36.7 C)  TempSrc:   Oral Oral  Resp: 14 16 16 16   SpO2: 99% 99% 98% 97%    ABD soft Sensation intact distally Dorsiflexion/Plantar flexion intact Incision: dressing C/D/I and scant drainage  LABS  No results found for this or any previous visit (from the past 24 hour(s)).  Dg Shoulder Right Port  12/19/2012  *RADIOLOGY REPORT*  Clinical Data: Postop  PORTABLE RIGHT SHOULDER - 2+ VIEW  Comparison: None.  Findings: The patient is status post right total shoulder arthroplasty.  Satisfactory position and alignment.  IMPRESSION: As above.   Original Report Authenticated By: Davonna Belling, M.D.     Assessment/Plan: 1 Day Post-Op   Principal Problem:  *Primary osteoarthritis of right shoulder   Advance diet Up with therapy Plan for discharge tomorrow Will continue to monitor pain control    Haskel Khan 12/20/2012, 7:23 AM   Teryl Lucy, MD Cell [phone] Pager [phone]

## 2012-12-20 NOTE — Evaluation (Signed)
Occupational Therapy Evaluation  *tolerated OT evaluation well and okay for d/c home 12/20/12  Patient Details Name: Lisa Morris MRN: [id number] DOB: [birth year] Today's Date: 12/20/2012 Time: 7829-5621 OT Time Calculation (min): 63 min  OT Assessment / Plan / Recommendation Clinical Impression  61 yo female s/p Rt TSA that is progressing well with OT and recommend outpatient follow up following the MD Landau appointment. OT to follow acutely    OT Assessment  Patient needs continued OT Services    Follow Up Recommendations  Outpatient OT (follow up with MD Dion Saucier then outpatient OT)    Barriers to Discharge      Equipment Recommendations  None recommended by OT    Recommendations for Other Services    Frequency  Min 3X/week    Precautions / Restrictions Precautions Precautions: Shoulder Precaution Comments: Sling at all times Restrictions Weight Bearing Restrictions: Yes RUE Weight Bearing: Non weight bearing   Pertinent Vitals/Pain 6 out 10 pain Ice applied for edema management    ADL  Eating/Feeding: Set up Where Assessed - Eating/Feeding: Chair Grooming: Wash/dry face;Teeth care;Wash/dry hands;Minimal assistance Where Assessed - Grooming: Unsupported sitting Upper Body Bathing: Chest;Right arm;Left arm;Abdomen;Moderate assistance Where Assessed - Upper Body Bathing: Unsupported sitting Lower Body Bathing: Minimal assistance Where Assessed - Lower Body Bathing: Supported sit to stand Upper Body Dressing: Minimal assistance Where Assessed - Upper Body Dressing: Unsupported sitting Lower Body Dressing: Minimal assistance Where Assessed - Lower Body Dressing: Supported sit to stand Toilet Transfer: Min Pension scheme manager Method: Sit to Barista: Raised toilet seat with arms (or 3-in-1 over toilet) Toileting - Clothing Manipulation and Hygiene: Minimal assistance Where Assessed - Toileting Clothing Manipulation and Hygiene: Sit to stand  from 3-in-1 or toilet Transfers/Ambulation Related to ADLs: pt ambulating supervision level at this time. NO LE deficits noted.  ADL Comments: Pt educated on supine<>sit with no weight bearing on right shoulder. Pt positioned on 3n1 for full adl at sink. Pt educated and participated in bath. Pt repositioned in chair at end of session. Pt educated on shoulder positioning and sling positioning. Pt in much better spirits now up and with bath. Pt reports a miscommunication with RN staff in the PM regarding medication. The RN was asking which of two options the patient would like to have and the patient misunderstood this question to mean that the RN didn't know what to give the patient. Pt received pain medication and now has better pain management then previously reported. Pt received pain medication at 6:Am and now under control . Ice applied for edema management. Pt husband and sister present at end of session without questions for OT. Pt feels confident to d/c home and requesting d/c. MD Dion Saucier called and notified of pain tolerance and ready for d/c home from OT standpoint.    OT Diagnosis: Generalized weakness;Acute pain  OT Problem List: Decreased strength;Decreased activity tolerance;Decreased knowledge of precautions;Pain OT Treatment Interventions: Self-care/ADL training;DME and/or AE instruction;Therapeutic activities;Balance training;Patient/family education   OT Goals Acute Rehab OT Goals OT Goal Formulation: With patient Time For Goal Achievement: 01/03/13 Potential to Achieve Goals: Good ADL Goals Pt Will Perform Upper Body Bathing: with set-up;Sit to stand from chair ADL Goal: Upper Body Bathing - Progress: Goal set today Pt Will Perform Lower Body Bathing: with set-up;Sit to stand from chair ADL Goal: Lower Body Bathing - Progress: Goal set today Pt Will Perform Upper Body Dressing: with set-up;Sit to stand from chair ADL Goal: Upper Body Dressing - Progress: Goal  set today Pt Will  Perform Lower Body Dressing: with set-up;Sit to stand from chair ADL Goal: Lower Body Dressing - Progress: Goal set today  Visit Information  Last OT Received On: 12/20/12 Assistance Needed: +1    Subjective Data  Subjective: "i will have more help than I need. Oh I won't be alone" Patient Stated Goal: to go home today   Prior Functioning     Home Living Lives With: Spouse Available Help at Discharge: Family Type of Home: House Prior Function Level of Independence: Independent Able to Take Stairs?: Yes Driving: Yes Communication Communication: No difficulties Dominant Hand: Left (pt responds I can use both- but answered left)         Vision/Perception     Cognition  Overall Cognitive Status: Appears within functional limits for tasks assessed/performed Arousal/Alertness: Awake/alert Orientation Level: Appears intact for tasks assessed Behavior During Session: [facility] for tasks performed    Extremity/Trunk Assessment Right Upper Extremity Assessment RUE ROM/Strength/Tone: Deficits RUE ROM/Strength/Tone Deficits: NO AROM shoulder, AROM elbow wrist and digits only Left Upper Extremity Assessment LUE ROM/Strength/Tone: Within functional levels Right Lower Extremity Assessment RLE ROM/Strength/Tone: Within functional levels Left Lower Extremity Assessment LLE ROM/Strength/Tone: Within functional levels Trunk Assessment Trunk Assessment: Normal     Mobility Bed Mobility Bed Mobility: Supine to Sit;Sitting - Scoot to Edge of Bed Supine to Sit: 4: Min assist;HOB flat Sitting - Scoot to [place] of Bed: 5: Supervision Transfers Transfers: Sit to Stand;Stand to Sit Sit to Stand: 4: Min guard;With upper extremity assist;From bed Stand to Sit: 4: Min guard;With upper extremity assist;To chair/3-in-1     Shoulder Instructions     Exercise     Balance     End of Session OT - End of Session Activity Tolerance: Patient tolerated treatment well Patient left: in  chair;with call bell/phone within reach;with family/visitor present Nurse Communication: Mobility status;Precautions  GO     Lucile Shutters 12/20/2012, 10:03 AM Pager: [phone]

## 2012-12-20 NOTE — Discharge Summary (Signed)
Physician Discharge Summary  Patient ID: Lisa Morris MRN: [id number] DOB/AGE: 02-21-1951 61 y.o.  Admit date: 12/19/2012 Discharge date: 12/20/2012  Admission Diagnoses:  Primary osteoarthritis of right shoulder  Discharge Diagnoses:  Principal Problem:  *Primary osteoarthritis of right shoulder   Past Medical History  Diagnosis Date  . Seasonal allergies   . Arthritis   . Headache     Takes Maxlt  . Complication of anesthesia   . Primary osteoarthritis of right shoulder 12/19/2012    Surgeries: Procedure(s): TOTAL SHOULDER ARTHROPLASTY on 12/19/2012   Consultants (if any):    Discharged Condition: Improved  Hospital Course: Lisa Morris is an 61 y.o. female who was admitted 12/19/2012 with a diagnosis of Primary osteoarthritis of right shoulder and went to the operating room on 12/19/2012 and underwent the above named procedures.    She was given perioperative antibiotics:  Anti-infectives     Start     Dose/Rate Route Frequency Ordered Stop   12/19/12 1900   ceFAZolin (ANCEF) IVPB 2 g/50 mL premix        2 g 100 mL/hr over 30 Minutes Intravenous Every 6 hours 12/19/12 1825 12/20/12 1259   12/18/12 1123   ceFAZolin (ANCEF) IVPB 2 g/50 mL premix        2 g 100 mL/hr over 30 Minutes Intravenous 60 min pre-op 12/18/12 1123 12/19/12 1318        .  She was given sequential compression devices, early ambulation for DVT prophylaxis.  She benefited maximally from the hospital stay and there were no complications.  I did get a postoperative CT scan to evaluate the implant position of the glenoid, and it was found to be satisfactory. Her glenoid vault itself was extremely small, and has some degree of retroversion, however I optimize the position of the implant in order to get optimal bone coverage, and if I had corrected a lot of her retroversion, this would have even further reduced the bone quantity of the vault. This would have further compromised the  platform.  Recent vital signs:  Filed Vitals:   12/20/12 0445  BP: 110/59  Pulse: 70  Temp: 98 F (36.7 C)  Resp: 16    Recent laboratory studies:  Lab Results  Component Value Date   HGB 11.2* 12/20/2012   HGB 13.7 12/12/2012   Lab Results  Component Value Date   WBC 10.9* 12/20/2012   PLT 160 12/20/2012   Lab Results  Component Value Date   INR 0.99 12/12/2012   Lab Results  Component Value Date   NA 136 12/20/2012   K 3.8 12/20/2012   CL 103 12/20/2012   CO2 23 12/20/2012   BUN 13 12/20/2012   CREATININE 0.73 12/20/2012   GLUCOSE 105* 12/20/2012    Discharge Medications:     Medication List     As of 12/20/2012 10:01 AM    STOP taking these medications         HYDROcodone-acetaminophen 7.5-325 MG per tablet   Commonly known as: NORCO      meloxicam 15 MG tablet   Commonly known as: MOBIC      TAKE these medications         cyclobenzaprine 10 MG tablet   Commonly known as: FLEXERIL   Take 20 mg by mouth every evening.      diclofenac sodium 1 % Gel   Commonly known as: VOLTAREN   Apply 2 g topically daily as needed. For shoulder pain  fluticasone 110 MCG/ACT inhaler   Commonly known as: FLOVENT HFA   Inhale 1-2 puffs into the lungs daily as needed. For cough      metoCLOPramide 10 MG tablet   Commonly known as: REGLAN   Take 10 mg by mouth 2 (two) times daily as needed. For nausea and vomiting      mometasone 50 MCG/ACT nasal spray   Commonly known as: NASONEX   Place 2 sprays into the nose daily as needed. For congestion      oxyCODONE-acetaminophen 10-325 MG per tablet   Commonly known as: PERCOCET   Take 1-2 tablets by mouth every 6 (six) hours as needed for pain. MAXIMUM TOTAL ACETAMINOPHEN DOSE IS 4000 MG PER DAY      promethazine 25 MG tablet   Commonly known as: PHENERGAN   Take 1 tablet (25 mg total) by mouth every 6 (six) hours as needed for nausea.      rizatriptan 10 MG tablet   Commonly known as: MAXALT   Take 10  mg by mouth as needed. May repeat in 2 hours if needed  For migraines      sennosides-docusate sodium 8.6-50 MG tablet   Commonly known as: SENOKOT-S   Take 1 tablet by mouth daily.      topiramate 100 MG tablet   Commonly known as: TOPAMAX   Take 100 mg by mouth every evening.      venlafaxine 75 MG tablet   Commonly known as: EFFEXOR   Take 75-150 mg by mouth 2 (two) times daily. Takes 2 tablets in the morning and 1 tablet in the evening        Diagnostic Studies: Dg Chest 2 View  12/12/2012  *RADIOLOGY REPORT*  Clinical Data: Preop for right heart surgery.  CHEST - 2 VIEW  Comparison: None  Findings: The cardiac silhouette, mediastinal and hilar contours are within normal limits.  The lungs are clear.  No pleural effusion.  The bony thorax is intact.  IMPRESSION: No acute cardiopulmonary findings.   Original Report Authenticated By: Rudie Meyer, M.D.    Dg Shoulder Right Port  12/19/2012  *RADIOLOGY REPORT*  Clinical Data: Postop  PORTABLE RIGHT SHOULDER - 2+ VIEW  Comparison: None.  Findings: The patient is status post right total shoulder arthroplasty.  Satisfactory position and alignment.  IMPRESSION: As above.   Original Report Authenticated By: Davonna Belling, M.D.     Disposition: home.      Discharge Orders    Future Orders Please Complete By Expires   Diet general      Call MD / Call 911      Comments:   If you experience chest pain or shortness of breath, CALL 911 and be transported to the hospital emergency room.  If you develope a fever above 101 F, pus (white drainage) or increased drainage or redness at the wound, or calf pain, call your surgeon's office.   Discharge instructions      Comments:   Change dressing in 3 days and reapply fresh dressing, unless you have a splint (half cast).  If you have a splint/cast, just leave in place until your follow-up appointment.    Keep wounds dry for 3 weeks.  Leave steri-strips in place on skin.  Do not apply lotion or  anything to the wound.   Constipation Prevention      Comments:   Drink plenty of fluids.  Prune juice may be helpful.  You may use a stool softener, such  as Colace (over the counter) 100 mg twice a day.  Use MiraLax (over the counter) for constipation as needed.   OT splint   12/19/13   Comments:   Ok for elbow, wrist, and hand motion.  Sling at all times except for hygiene, no shoulder activity until 6 weeks.      Follow-up Information    Follow up with Eulas Post, MD. Schedule an appointment as soon as possible for a visit in 2 weeks.   Contact information:   [address] [place] [address] [phone]           Signed: Eulas Post 12/20/2012, 10:01 AM

## 2012-12-20 NOTE — Progress Notes (Signed)
Patient requested to go home this afternoon as the pain was much improved from earlier when seen by PA. Called MD and gave order to resume discharge today.

## 2012-12-20 NOTE — Progress Notes (Signed)
UR COMPLETED  

## 2012-12-27 ENCOUNTER — Encounter (HOSPITAL_COMMUNITY): Payer: Self-pay | Admitting: Orthopedic Surgery

## 2015-10-30 ENCOUNTER — Other Ambulatory Visit: Payer: Self-pay | Admitting: Family Medicine

## 2015-10-30 ENCOUNTER — Ambulatory Visit
Admission: RE | Admit: 2015-10-30 | Discharge: 2015-10-30 | Disposition: A | Discharge status: Home / Self Care | Payer: BLUE CROSS/BLUE SHIELD | Source: Ambulatory Visit | Attending: Family Medicine | Admitting: Family Medicine

## 2015-10-30 DIAGNOSIS — T148XXA Other injury of unspecified body region, initial encounter: Secondary | ICD-10-CM

## 2018-02-11 ENCOUNTER — Encounter (INDEPENDENT_AMBULATORY_CARE_PROVIDER_SITE_OTHER): Payer: Self-pay

## 2018-02-11 ENCOUNTER — Encounter: Payer: Self-pay | Admitting: Adult Health

## 2018-02-11 ENCOUNTER — Ambulatory Visit (INDEPENDENT_AMBULATORY_CARE_PROVIDER_SITE_OTHER): Payer: Medicare Other | Admitting: Adult Health

## 2018-02-11 VITALS — BP 146/90 | Temp 98.6°F | Wt 179.0 lb

## 2018-02-11 DIAGNOSIS — J0100 Acute maxillary sinusitis, unspecified: Secondary | ICD-10-CM | POA: Diagnosis not present

## 2018-02-11 DIAGNOSIS — Z23 Encounter for immunization: Secondary | ICD-10-CM | POA: Diagnosis not present

## 2018-02-11 DIAGNOSIS — Z7689 Persons encountering health services in other specified circumstances: Secondary | ICD-10-CM

## 2018-02-11 DIAGNOSIS — G43801 Other migraine, not intractable, with status migrainosus: Secondary | ICD-10-CM | POA: Diagnosis not present

## 2018-02-11 DIAGNOSIS — Z1211 Encounter for screening for malignant neoplasm of colon: Secondary | ICD-10-CM

## 2018-02-11 DIAGNOSIS — G43909 Migraine, unspecified, not intractable, without status migrainosus: Secondary | ICD-10-CM | POA: Insufficient documentation

## 2018-02-11 MED ORDER — DOXYCYCLINE HYCLATE 100 MG PO CAPS: 100.0000 mg | ORAL_CAPSULE | Freq: Two times a day (BID) | ORAL | 0 refills | Status: DC

## 2018-02-11 NOTE — Addendum Note (Signed)
Addended by: Miles Costain T on: 02/11/2018 12:10 PM   Modules accepted: Orders

## 2018-02-11 NOTE — Progress Notes (Signed)
Patient presents to clinic today to establish care. She is a pleasant 67 year old female who  has a past medical history of Arthritis, Chicken pox, Complication of anesthesia, Headache(784.0), Primary osteoarthritis of right shoulder (12/19/2012), and Seasonal allergies.  Her last physical was about three years ago.    Acute Concerns: Establish Care   Sinusitis - Present for two months. Symptoms include, sinus pain and pressure tooth pain, PND, rhinorrhea, and sore throat. Endorses subjective fever. She has been using OTC nasal sprays and netty pot without relief.   Chronic Issues: Migraines - Topamax 100 mg daily and Effexor 225 XR m. She takes Maxalt and Flexeril as needed. Her migraines are well controlled.    Health Maintenance: Dental -- Does not do routine care  Vision -- Routine  Immunizations -- Needs pneumonia vaccinations.  Colonoscopy -- Never had  Mammogram -- 5 years ago.  PAP --  3 years ago  Bone Density -- Never had  Diet: Does not eat a lot of processed foods  Exercise: Does not exercise on a routine basis.   Past Medical History:  Diagnosis Date  . Arthritis   . Chicken pox   . Complication of anesthesia   . Headache(784.0)    Takes Maxlt  . Primary osteoarthritis of right shoulder 12/19/2012  . Seasonal allergies     Past Surgical History:  Procedure Laterality Date  . [facility]    . LIPOSUCTION     Neck  . TOTAL SHOULDER ARTHROPLASTY  12/19/2012   Procedure: TOTAL SHOULDER ARTHROPLASTY;  Surgeon: [name], MD;  Location: [facility];  Service: Orthopedics;  Laterality: Right;    Current Outpatient Medications on File Prior to Visit  Medication Sig Dispense Refill  . cyclobenzaprine (FLEXERIL) 10 MG tablet Take 20 mg by mouth every evening.    . fluticasone (FLOVENT HFA) 110 MCG/ACT inhaler Inhale into the lungs 2 (two) times daily.    . mometasone (NASONEX) 50 MCG/ACT nasal spray Place 2 sprays into the nose daily.    . MULTIPLE VITAMIN PO  SENTRY SENIOR - 1 TAB DAILY    . promethazine (PHENERGAN) 25 MG tablet Take 1 tablet (25 mg total) by mouth every 6 (six) hours as needed for nausea. 30 tablet 0  . rizatriptan (MAXALT) 10 MG tablet Take 10 mg by mouth as needed. May repeat in 2 hours if needed For migraines    . topiramate (TOPAMAX) 100 MG tablet Take 100 mg by mouth every evening.    . venlafaxine XR (EFFEXOR-XR) 75 MG 24 hr capsule Take 3 tablets by mouth at bedtime     No current facility-administered medications on file prior to visit.     No Known Allergies  Family History  Problem Relation Age of Onset  . Arthritis Mother   . Hearing loss Mother   . Hypertension Mother   . Hearing loss Father   . Hyperlipidemia Father   . Hypertension Father   . Stroke Father   . Heart disease Maternal Grandmother     Social History   Socioeconomic History  . Marital status: Married    Spouse name: Not on file  . Number of children: Not on file  . Years of education: Not on file  . Highest education level: Not on file  Social Needs  . Financial resource strain: Not on file  . Food insecurity - worry: Not on file  . Food insecurity - inability: Not on file  . Transportation needs -  medical: Not on file  . Transportation needs - non-medical: Not on file  Occupational History  . Not on file  Tobacco Use  . Smoking status: Never Smoker  . Smokeless tobacco: Never Used  Substance and Sexual Activity  . Alcohol use: Yes    Alcohol/week: 18.0 oz    Types: 30 Glasses of wine per week  . Drug use: No  . Sexual activity: Not on file  Other Topics Concern  . Not on file  Social History Narrative  . Not on file    Review of Systems  Constitutional: Negative.   HENT: Positive for congestion, sinus pain and sore throat. Negative for ear discharge and ear pain.   Eyes: Negative.   Respiratory: Negative.  Negative for cough, shortness of breath and wheezing.   Cardiovascular: Negative.   Gastrointestinal: Negative.    Genitourinary: Negative.   Musculoskeletal: Negative.   Skin: Negative.   Neurological: Positive for headaches.  Endo/Heme/Allergies: Negative.   Psychiatric/Behavioral: Negative.   All other systems reviewed and are negative.   BP (!) 146/90 (BP Location: Left Arm)   Temp 98.6 F (37 C) (Oral)   Wt 179 lb (81.2 kg)   BMI 32.74 kg/m   Physical Exam  Constitutional: She is oriented to person, place, and time and well-developed, well-nourished, and in no distress. No distress.  HENT:  Head: Normocephalic and atraumatic.  Right Ear: Hearing, tympanic membrane, external ear and ear canal normal.  Left Ear: Hearing, external ear and ear canal normal.  Nose: Mucosal edema and rhinorrhea present. Right sinus exhibits maxillary sinus tenderness. Right sinus exhibits no frontal sinus tenderness. Left sinus exhibits maxillary sinus tenderness. Left sinus exhibits no frontal sinus tenderness.  Mouth/Throat: Uvula is midline and mucous membranes are normal. Posterior oropharyngeal erythema (trace ) present. No oropharyngeal exudate or posterior oropharyngeal edema.  Eyes: Conjunctivae and EOM are normal. Pupils are equal, round, and reactive to light. Right eye exhibits no discharge. Left eye exhibits no discharge. No scleral icterus.  Neck: Normal range of motion. Neck supple. No JVD present. No tracheal deviation present. No thyromegaly present.  Cardiovascular: Normal rate, regular rhythm, normal heart sounds and intact distal pulses. Exam reveals no gallop and no friction rub.  No murmur heard. Pulmonary/Chest: Effort normal and breath sounds normal. No stridor. No respiratory distress. She has no wheezes. She has no rales. She exhibits no tenderness.  Abdominal: Soft. Bowel sounds are normal. She exhibits no distension and no mass. There is no tenderness. There is no rebound and no guarding.  Musculoskeletal: Normal range of motion. She exhibits no edema, tenderness or deformity.    Lymphadenopathy:    She has no cervical adenopathy.  Neurological: She is alert and oriented to person, place, and time. She displays normal reflexes. No cranial nerve deficit. She exhibits normal muscle tone. Gait normal. Coordination normal. GCS score is 15.  Skin: Skin is warm and dry. No rash noted. She is not diaphoretic. No erythema. No pallor.  Psychiatric: Mood, memory, affect and judgment normal.  Nursing note and vitals reviewed.   Assessment/Plan: 1. Encounter to establish care - Will request records from previous provider  - Follow up for CPE  - Make an appointment for mammogram and dexa scan - she will do this  -Would like her to start exercising to help lose weight - Prevnar 13 given   2. Colon cancer screening  - Ambulatory referral to Gastroenterology  3. Other migraine with status migrainosus, not intractable - Well  controlled with current medication regimen   4. Acute non-recurrent maxillary sinusitis  - doxycycline (VIBRAMYCIN) 100 MG capsule; Take 1 capsule (100 mg total) by mouth 2 (two) times daily.  Dispense: 14 capsule; Refill: 0   Dorothyann Peng, NP

## 2018-04-14 ENCOUNTER — Encounter: Payer: Self-pay | Admitting: Adult Health

## 2018-04-14 ENCOUNTER — Ambulatory Visit (INDEPENDENT_AMBULATORY_CARE_PROVIDER_SITE_OTHER): Payer: Medicare Other | Admitting: Adult Health

## 2018-04-14 VITALS — BP 120/84 | Temp 98.3°F | Ht 61.75 in | Wt 176.0 lb

## 2018-04-14 DIAGNOSIS — Z114 Encounter for screening for human immunodeficiency virus [HIV]: Secondary | ICD-10-CM | POA: Diagnosis not present

## 2018-04-14 DIAGNOSIS — Z0000 Encounter for general adult medical examination without abnormal findings: Secondary | ICD-10-CM

## 2018-04-14 DIAGNOSIS — G43801 Other migraine, not intractable, with status migrainosus: Secondary | ICD-10-CM | POA: Diagnosis not present

## 2018-04-14 DIAGNOSIS — Z1159 Encounter for screening for other viral diseases: Secondary | ICD-10-CM

## 2018-04-14 LAB — HEPATIC FUNCTION PANEL
ALT: 15 U/L (ref 0–35)
AST: 21 U/L (ref 0–37)
Albumin: 4.5 g/dL (ref 3.5–5.2)
Alkaline Phosphatase: 63 U/L (ref 39–117)
BILIRUBIN DIRECT: 0.1 mg/dL (ref 0.0–0.3)
TOTAL PROTEIN: 6.9 g/dL (ref 6.0–8.3)
Total Bilirubin: 0.7 mg/dL (ref 0.2–1.2)

## 2018-04-14 LAB — CBC WITH DIFFERENTIAL/PLATELET
BASOS ABS: 0.1 10*3/uL (ref 0.0–0.1)
Basophils Relative: 1.5 % (ref 0.0–3.0)
Eosinophils Absolute: 0.4 10*3/uL (ref 0.0–0.7)
Eosinophils Relative: 7.3 % — ABNORMAL HIGH (ref 0.0–5.0)
HCT: 42.6 % (ref 36.0–46.0)
Hemoglobin: 14.3 g/dL (ref 12.0–15.0)
LYMPHS ABS: 1.8 10*3/uL (ref 0.7–4.0)
Lymphocytes Relative: 33.1 % (ref 12.0–46.0)
MCHC: 33.6 g/dL (ref 30.0–36.0)
MCV: 88.2 fl (ref 78.0–100.0)
MONO ABS: 0.4 10*3/uL (ref 0.1–1.0)
Monocytes Relative: 6.8 % (ref 3.0–12.0)
NEUTROS ABS: 2.8 10*3/uL (ref 1.4–7.7)
NEUTROS PCT: 51.3 % (ref 43.0–77.0)
PLATELETS: 193 10*3/uL (ref 150.0–400.0)
RBC: 4.83 Mil/uL (ref 3.87–5.11)
RDW: 14.4 % (ref 11.5–15.5)
WBC: 5.4 10*3/uL (ref 4.0–10.5)

## 2018-04-14 LAB — BASIC METABOLIC PANEL
BUN: 12 mg/dL (ref 6–23)
CALCIUM: 9.2 mg/dL (ref 8.4–10.5)
CO2: 28 meq/L (ref 19–32)
Chloride: 106 mEq/L (ref 96–112)
Creatinine, Ser: 0.86 mg/dL (ref 0.40–1.20)
GFR: 70.32 mL/min (ref >60.00)
Glucose, Bld: 83 mg/dL (ref 70–99)
Potassium: 4.4 mEq/L (ref 3.5–5.1)
Sodium: 140 mEq/L (ref 135–145)

## 2018-04-14 LAB — TSH: TSH: 1.76 u[IU]/mL (ref 0.35–4.50)

## 2018-04-14 LAB — LIPID PANEL
CHOL/HDL RATIO: 3
Cholesterol: 220 mg/dL — ABNORMAL HIGH (ref 0–200)
HDL: 86.2 mg/dL (ref >39.00)
LDL Cholesterol: 115 mg/dL — ABNORMAL HIGH (ref 0–99)
NonHDL: 133.7
Triglycerides: 92 mg/dL (ref 0.0–149.0)
VLDL: 18.4 mg/dL (ref 0.0–40.0)

## 2018-04-14 LAB — HEMOGLOBIN A1C: HEMOGLOBIN A1C: 5.4 % (ref 4.6–6.5)

## 2018-04-14 MED ORDER — VENLAFAXINE HCL ER 75 MG PO CP24: 225.0000 mg | ORAL_CAPSULE | Freq: Every day | ORAL | 3 refills | Status: DC

## 2018-04-14 NOTE — Progress Notes (Signed)
Subjective:    Patient ID: Lisa Morris, female    DOB: 11/05/1951, 67 y.o.   MRN: [id number]  HPI Patient presents for yearly preventative medicine examination. She is a pleasant 67 year old female who  has a past medical history of Arthritis, Chicken pox, Complication of anesthesia, Headache(784.0), Primary osteoarthritis of right shoulder (12/19/2012), and Seasonal allergies.  Migraines -takes Topamax 100 mg daily and Effexor 225 extended release daily.  As needed she will take Maxalt and Flexeril.  She feels as though her migraines are well controlled   All immunizations and health maintenance protocols were reviewed with the patient and needed orders were placed.  Appropriate screening laboratory values were ordered for the patient including screening of hyperlipidemia, renal function and hepatic function.  Medication reconciliation,  past medical history, social history, problem list and allergies were reviewed in detail with the patient  Goals were established with regard to weight loss, exercise, and  diet in compliance with medications.  She does not exercise on a regular basis.  Diet is heart healthy, she eats a lot of processed foods  End of life planning was discussed.  She does not do routine dental exams but is up-to-date on her vision exam.  She has not scheduled her colonoscopy.  Also has not scheduled her mammogram and DEXA scan   Review of Systems  Constitutional: Negative.   HENT: Negative.   Eyes: Negative.   Respiratory: Negative.   Cardiovascular: Negative.   Gastrointestinal: Negative.   Endocrine: Negative.   Genitourinary: Negative.   Musculoskeletal: Negative.   Skin: Negative.   Allergic/Immunologic: Negative.   Neurological: Negative.   Hematological: Negative.   Psychiatric/Behavioral: Negative.    Past Medical History:  Diagnosis Date  . Arthritis   . Chicken pox   . Complication of anesthesia   . Headache(784.0)    Takes Maxlt  .  Primary osteoarthritis of right shoulder 12/19/2012  . Seasonal allergies     Social History   Socioeconomic History  . Marital status: Married    Spouse name: Not on file  . Number of children: Not on file  . Years of education: Not on file  . Highest education level: Not on file  Occupational History  . Not on file  Social Needs  . Financial resource strain: Not on file  . Food insecurity:    Worry: Not on file    Inability: Not on file  . Transportation needs:    Medical: Not on file    Non-medical: Not on file  Tobacco Use  . Smoking status: Never Smoker  . Smokeless tobacco: Never Used  Substance and Sexual Activity  . Alcohol use: Yes    Alcohol/week: 4.8 oz    Types: 8 Glasses of wine per week  . Drug use: No  . Sexual activity: Not on file  Lifestyle  . Physical activity:    Days per week: Not on file    Minutes per session: Not on file  . Stress: Not on file  Relationships  . Social connections:    Talks on phone: Not on file    Gets together: Not on file    Attends religious service: Not on file    Active member of club or organization: Not on file    Attends meetings of clubs or organizations: Not on file    Relationship status: Not on file  . Intimate partner violence:    Fear of current or ex partner: Not on file  Emotionally abused: Not on file    Physically abused: Not on file    Forced sexual activity: Not on file  Other Topics Concern  . Not on file  Social History Narrative   Married     Past Surgical History:  Procedure Laterality Date  . [facility]    . LIPOSUCTION     Neck  . TOTAL SHOULDER ARTHROPLASTY  12/19/2012   Procedure: TOTAL SHOULDER ARTHROPLASTY;  Surgeon: [name], MD;  Location: [facility];  Service: Orthopedics;  Laterality: Right;    Family History  Problem Relation Age of Onset  . Arthritis Mother   . Hearing loss Mother   . Hypertension Mother   . Hearing loss Father   . Hyperlipidemia Father   .  Hypertension Father   . Stroke Father   . Heart disease Maternal Grandmother   . Rheumatic fever Maternal Grandmother     No Known Allergies  Current Outpatient Medications on File Prior to Visit  Medication Sig Dispense Refill  . cyclobenzaprine (FLEXERIL) 10 MG tablet Take 20 mg by mouth every evening.    Marland Kitchen doxycycline (VIBRAMYCIN) 100 MG capsule Take 1 capsule (100 mg total) by mouth 2 (two) times daily. 14 capsule 0  . fluticasone (FLOVENT HFA) 110 MCG/ACT inhaler Inhale into the lungs 2 (two) times daily.    . mometasone (NASONEX) 50 MCG/ACT nasal spray Place 2 sprays into the nose daily.    . MULTIPLE VITAMIN PO SENTRY SENIOR - 1 TAB DAILY    . promethazine (PHENERGAN) 25 MG tablet Take 1 tablet (25 mg total) by mouth every 6 (six) hours as needed for nausea. 30 tablet 0  . rizatriptan (MAXALT) 10 MG tablet Take 10 mg by mouth as needed. May repeat in 2 hours if needed For migraines    . topiramate (TOPAMAX) 100 MG tablet Take 100 mg by mouth every evening.    . venlafaxine XR (EFFEXOR-XR) 75 MG 24 hr capsule Take 3 tablets by mouth at bedtime     No current facility-administered medications on file prior to visit.     There were no vitals taken for this visit.      Objective:   Physical Exam  Constitutional: She is oriented to person, place, and time. She appears well-developed and well-nourished. No distress.  HENT:  Head: Normocephalic and atraumatic.  Right Ear: External ear normal.  Left Ear: External ear normal.  Nose: Nose normal.  Mouth/Throat: Oropharynx is clear and moist. No oropharyngeal exudate.  Eyes: Pupils are equal, round, and reactive to light. Conjunctivae and EOM are normal. Right eye exhibits no discharge. Left eye exhibits no discharge. No scleral icterus.  Neck: Normal range of motion. Neck supple. No JVD present. No tracheal deviation present. No thyromegaly present.  Cardiovascular: Normal rate, regular rhythm, normal heart sounds and intact  distal pulses. Exam reveals no gallop and no friction rub.  No murmur heard. Pulmonary/Chest: Effort normal and breath sounds normal. No stridor. No respiratory distress. She has no wheezes. She has no rales. She exhibits no tenderness.  Abdominal: Soft. Bowel sounds are normal. She exhibits no distension and no mass. There is no tenderness. There is no rebound and no guarding.  Musculoskeletal: Normal range of motion. She exhibits no edema, tenderness or deformity.  Lymphadenopathy:    She has no cervical adenopathy.  Neurological: She is alert and oriented to person, place, and time. She has normal reflexes. She displays normal reflexes. No cranial nerve deficit. She exhibits  normal muscle tone. Coordination normal.  Skin: Skin is warm and dry. No rash noted. She is not diaphoretic. No erythema. No pallor.  Psychiatric: She has a normal mood and affect. Her behavior is normal. Judgment and thought content normal.  Nursing note and vitals reviewed.     Assessment & Plan:

## 2018-04-14 NOTE — Progress Notes (Signed)
Subjective:  Patient presents today for their annual wellness visit. She is a pleasant 67 year old female who  has a past medical history of Arthritis, Chicken pox, Complication of anesthesia, Headache(784.0), Primary osteoarthritis of right shoulder (12/19/2012), and Seasonal allergies.  Migraines -takes Topamax 100 mg daily and Effexor 225 extended release daily.  As needed she will take Maxalt and Flexeril.  She feels as though her migraines are well controlled   Preventive Screening-Counseling & Management  Smoking Status: Never Smoker Second Hand Smoking status:No smokers in home  Risk Factors Regular exercise: Has been going to [employer] and using the treadmill recently.  Diet: Eats a lot of processed foods.  Fall Risk:None   Cardiac risk factors:  advanced age (older than 69 for men, 40 for women)  No Hyperlipidemia  No diabetes.  Family History: Mother: Hypertension/Father: HTN/Hyperlipidemia/Stroke  Depression Screen None. PHQ2 0   Activities of Daily Living Independent ADLs and IADLs  Hearing Difficulties: patient declines  Cognitive Testing No reported trouble.   Normal 3 word recall  List the Names of Other Physician/Practitioners you currently use: - None   Immunization History  Administered Date(s) Administered  . Pneumococcal Conjugate-13 02/11/2018  . Tdap 07/27/2014   Required Immunizations needed today: None   Screening tests- Has not scheduled Mammogram, Colonoscopy, or bone density screen. She is planning on scheduling these exams.  Health Maintenance Due  Topic Date Due  . Hepatitis C Screening  November 28, 1951  . HIV Screening  12/31/1967  . PAP SMEAR  12/30/1973  . MAMMOGRAM  12/30/2002  . COLONOSCOPY  12/30/2002  . DEXA SCAN  12/30/2017    ROS- No pertinent positives discovered in course of AWV  The following were reviewed and entered/updated in epic: Past Medical History:  Diagnosis Date  . Arthritis   . Chicken  pox   . Complication of anesthesia   . Headache(784.0)    Takes Maxlt  . Primary osteoarthritis of right shoulder 12/19/2012  . Seasonal allergies    Patient Active Problem List   Diagnosis Date Noted  . Migraine 02/11/2018  . Primary osteoarthritis of right shoulder 12/19/2012   Past Surgical History:  Procedure Laterality Date  . [facility]    . LIPOSUCTION     Neck  . TOTAL SHOULDER ARTHROPLASTY  12/19/2012   Procedure: TOTAL SHOULDER ARTHROPLASTY;  Surgeon: [name], MD;  Location: [place];  Service: Orthopedics;  Laterality: Right;    Family History  Problem Relation Age of Onset  . Arthritis Mother   . Hearing loss Mother   . Hypertension Mother   . Hearing loss Father   . Hyperlipidemia Father   . Hypertension Father   . Stroke Father   . Heart disease Maternal Grandmother   . Rheumatic fever Maternal Grandmother     Medications- reviewed and updated Current Outpatient Medications  Medication Sig Dispense Refill  . cyclobenzaprine (FLEXERIL) 10 MG tablet Take 20 mg by mouth every evening.    . fluticasone (FLONASE) 50 MCG/ACT nasal spray Place into both nostrils daily.    . MULTIPLE VITAMIN PO SENTRY SENIOR - 1 TAB DAILY    . promethazine (PHENERGAN) 25 MG tablet Take 1 tablet (25 mg total) by mouth every 6 (six) hours as needed for nausea. 30 tablet 0  . rizatriptan (MAXALT) 10 MG tablet Take 10 mg by mouth as needed. May repeat in 2 hours if needed For migraines    . topiramate (TOPAMAX) 100 MG tablet Take  100 mg by mouth every evening.    . venlafaxine XR (EFFEXOR-XR) 75 MG 24 hr capsule Take 3 tablets by mouth at bedtime    . fluticasone (FLOVENT HFA) 110 MCG/ACT inhaler Inhale into the lungs 2 (two) times daily.     No current facility-administered medications for this visit.     Allergies-reviewed and updated No Known Allergies  Social History   Socioeconomic History  . Marital status: Married    Spouse name: Not on file  . Number of  children: Not on file  . Years of education: Not on file  . Highest education level: Not on file  Occupational History  . Not on file  Social Needs  . Financial resource strain: Not on file  . Food insecurity:    Worry: Not on file    Inability: Not on file  . Transportation needs:    Medical: Not on file    Non-medical: Not on file  Tobacco Use  . Smoking status: Never Smoker  . Smokeless tobacco: Never Used  Substance and Sexual Activity  . Alcohol use: Yes    Alcohol/week: 4.8 oz    Types: 8 Glasses of wine per week  . Drug use: No  . Sexual activity: Not on file  Lifestyle  . Physical activity:    Days per week: Not on file    Minutes per session: Not on file  . Stress: Not on file  Relationships  . Social connections:    Talks on phone: Not on file    Gets together: Not on file    Attends religious service: Not on file    Active member of club or organization: Not on file    Attends meetings of clubs or organizations: Not on file    Relationship status: Not on file  Other Topics Concern  . Not on file  Social History Narrative   Married     Objective: Temp 98.3 F (36.8 C) (Oral)   Ht 5' 1.75" (1.568 m)   Wt 176 lb (79.8 kg)   BMI 32.45 kg/m  Gen: NAD, resting comfortably HEENT: Mucous membranes are moist. Oropharynx normal. Abnormal dentition  Neck: no thyromegaly CV: RRR no murmurs rubs or gallops Lungs: CTAB no crackles, wheeze, rhonchi Abdomen: soft/nontender/nondistended/normal bowel sounds. No rebound or guarding. Obese  Ext: no edema Breast: Fibrotic breast tissue. No masses, dimpling, or discharge noted.  Skin: warm, dry Neuro: grossly normal, moves all extremities, PERRLA  Assessment/Plan:  1. Welcome to Medicare preventive visit - Encouraged to schedule mammogram, colonoscopy, and dexa scan  - Follow up in one year or sooner if needed - Basic metabolic panel - CBC with Differential/Platelet - Hemoglobin A1c - Hepatic function panel -  Lipid panel - TSH - EKG 12-Lead- Sinus  Rhythm  -Left axis -anterior fascicular block. Rate 80 - consistent with previous EKG's   2. Need for hepatitis C screening test  - Hep C Antibody  3. Encounter for screening for HIV  - HIV antibody  4. Other migraine with status migrainosus, not intractable - Continue with current medication regimen    No problem-specific Assessment & Plan notes found for this encounter.  Return precautions advised.   Orders Placed This Encounter  Procedures  . Basic metabolic panel    Order Specific Question:   Has the patient fasted?    Answer:   Yes  . CBC with Differential/Platelet  . Hemoglobin A1c  . Hepatic function panel  . Lipid panel  Order Specific Question:   Has the patient fasted?    Answer:   Yes  . TSH  . EKG 12-Lead   Dorothyann Peng, NP

## 2018-04-14 NOTE — Patient Instructions (Signed)
Please schedule colonoscopy, mammogram, and bone density scan   I will follow up with you regarding your blood work

## 2018-04-15 LAB — HEPATITIS C ANTIBODY
HEP C AB: NONREACTIVE
SIGNAL TO CUT-OFF: 0.01 (ref <1.00)

## 2018-04-15 LAB — HIV ANTIBODY (ROUTINE TESTING W REFLEX): HIV 1&2 Ab, 4th Generation: NONREACTIVE

## 2018-04-18 ENCOUNTER — Encounter: Payer: Self-pay | Admitting: Adult Health

## 2018-04-22 ENCOUNTER — Encounter: Payer: Self-pay | Admitting: Family Medicine

## 2018-04-22 ENCOUNTER — Telehealth: Payer: Self-pay | Admitting: Adult Health

## 2018-04-22 NOTE — Telephone Encounter (Signed)
Patient calling back and would like to communicate with nurse regarding results through [facility] Copied from [place] [id number]. Topic: Quick Communication - Lab Results >> Apr 20, 2018 11:23 AM Miles Costain T, CMA wrote: Called patient to inform them of all lab results from 04/14/18. When patient returns call, triage nurse may disclose results.

## 2018-04-22 NOTE — Telephone Encounter (Signed)
Message sent by MyChart per pt request.

## 2018-04-25 MED ORDER — ATORVASTATIN CALCIUM 10 MG PO TABS: 10.0000 mg | ORAL_TABLET | Freq: Every day | ORAL | 3 refills | Status: DC

## 2018-06-24 ENCOUNTER — Encounter: Payer: Self-pay | Admitting: Adult Health

## 2018-07-19 ENCOUNTER — Ambulatory Visit (INDEPENDENT_AMBULATORY_CARE_PROVIDER_SITE_OTHER): Payer: Medicare Other | Admitting: Adult Health

## 2018-07-19 ENCOUNTER — Encounter: Payer: Self-pay | Admitting: Adult Health

## 2018-07-19 VITALS — BP 134/80 | Temp 98.3°F | Wt 178.0 lb

## 2018-07-19 DIAGNOSIS — M25562 Pain in left knee: Secondary | ICD-10-CM | POA: Diagnosis not present

## 2018-07-19 DIAGNOSIS — J069 Acute upper respiratory infection, unspecified: Secondary | ICD-10-CM

## 2018-07-19 MED ORDER — METHYLPREDNISOLONE ACETATE 80 MG/ML IJ SUSP
80.0000 mg | Freq: Once | INTRAMUSCULAR | Status: AC
  Administered 2018-07-19: 80 mg via INTRA_ARTICULAR

## 2018-07-19 NOTE — Progress Notes (Signed)
Subjective:    Patient ID: Lisa Morris, female    DOB: 1951-03-15, 67 y.o.   MRN: [id number]  HPI 67 year old female who  has a past medical history of Arthritis, Chicken pox, Complication of anesthesia, Headache(784.0), Primary osteoarthritis of right shoulder (12/19/2012), and Seasonal allergies.   She presents to the office today for multiple acute issues.   1. Cough and right sided ear fullness - has been present for 7-10 days, gradually improving over the last 2-3 days. Reports productive cough at the beginning but has turned into a dry cough. Denies fevers, sinus pain/pressure, or feeling acutely ill. She has been using Mucinex, sinus rinse, and flonase.   2. Left knee pain - has been present for 2 months. Pain is worse with certain movements such as twisting and bending and when laying in bed on her stomach. Pain is always behind the knee. She denies any redness, warmth, or calf pain. Has not had any trauma to the knee  Review of Systems See HPI   Past Medical History:  Diagnosis Date  . Arthritis   . Chicken pox   . Complication of anesthesia   . Headache(784.0)    Takes Maxlt  . Primary osteoarthritis of right shoulder 12/19/2012  . Seasonal allergies     Social History   Socioeconomic History  . Marital status: Married    Spouse name: Not on file  . Number of children: Not on file  . Years of education: Not on file  . Highest education level: Not on file  Occupational History  . Not on file  Social Needs  . Financial resource strain: Not on file  . Food insecurity:    Worry: Not on file    Inability: Not on file  . Transportation needs:    Medical: Not on file    Non-medical: Not on file  Tobacco Use  . Smoking status: Never Smoker  . Smokeless tobacco: Never Used  Substance and Sexual Activity  . Alcohol use: Yes    Alcohol/week: 4.8 oz    Types: 8 Glasses of wine per week  . Drug use: No  . Sexual activity: Not on file  Lifestyle  . Physical  activity:    Days per week: Not on file    Minutes per session: Not on file  . Stress: Not on file  Relationships  . Social connections:    Talks on phone: Not on file    Gets together: Not on file    Attends religious service: Not on file    Active member of club or organization: Not on file    Attends meetings of clubs or organizations: Not on file    Relationship status: Not on file  . Intimate partner violence:    Fear of current or ex partner: Not on file    Emotionally abused: Not on file    Physically abused: Not on file    Forced sexual activity: Not on file  Other Topics Concern  . Not on file  Social History Narrative   Married     Past Surgical History:  Procedure Laterality Date  . [facility]    . LIPOSUCTION     Neck  . TOTAL SHOULDER ARTHROPLASTY  12/19/2012   Procedure: TOTAL SHOULDER ARTHROPLASTY;  Surgeon: [name], MD;  Location: [place];  Service: Orthopedics;  Laterality: Right;    Family History  Problem Relation Age of Onset  . Arthritis Mother   .  Hearing loss Mother   . Hypertension Mother   . Hearing loss Father   . Hyperlipidemia Father   . Hypertension Father   . Stroke Father   . Heart disease Maternal Grandmother   . Rheumatic fever Maternal Grandmother     No Known Allergies  Current Outpatient Medications on File Prior to Visit  Medication Sig Dispense Refill  . atorvastatin (LIPITOR) 10 MG tablet Take 1 tablet (10 mg total) by mouth daily. 90 tablet 3  . cyclobenzaprine (FLEXERIL) 10 MG tablet Take 20 mg by mouth every evening.    . fluticasone (FLONASE) 50 MCG/ACT nasal spray Place into both nostrils daily.    . MULTIPLE VITAMIN PO SENTRY SENIOR - 1 TAB DAILY    . promethazine (PHENERGAN) 25 MG tablet Take 1 tablet (25 mg total) by mouth every 6 (six) hours as needed for nausea. 30 tablet 0  . rizatriptan (MAXALT) 10 MG tablet Take 10 mg by mouth as needed. May repeat in 2 hours if needed For migraines    . topiramate  (TOPAMAX) 100 MG tablet Take 100 mg by mouth every evening.    . venlafaxine XR (EFFEXOR-XR) 75 MG 24 hr capsule Take 3 capsules (225 mg total) by mouth at bedtime. Take 3 tablets by mouth at bedtime 270 capsule 3   No current facility-administered medications on file prior to visit.     BP 134/80   Temp 98.3 F (36.8 C) (Oral)   Wt 178 lb (80.7 kg)   BMI 32.82 kg/m       Objective:   Physical Exam  Constitutional: She is oriented to person, place, and time. She appears well-developed and well-nourished. No distress.  HENT:  Right Ear: Hearing, external ear and ear canal normal. Tympanic membrane is not erythematous and not bulging. A middle ear effusion is present.  Left Ear: Hearing, external ear and ear canal normal. Tympanic membrane is not erythematous and not bulging. A middle ear effusion is present.  Nose: Nose normal. No mucosal edema or rhinorrhea. Right sinus exhibits no maxillary sinus tenderness and no frontal sinus tenderness. Left sinus exhibits no maxillary sinus tenderness and no frontal sinus tenderness.  Mouth/Throat: Uvula is midline, oropharynx is clear and moist and mucous membranes are normal.  Cardiovascular: Normal rate, regular rhythm, normal heart sounds and intact distal pulses.  Pulmonary/Chest: Effort normal and breath sounds normal. No stridor. No respiratory distress. She has no wheezes. She has no rales. She exhibits no tenderness.  Musculoskeletal:       Left knee: Normal. She exhibits normal range of motion, no swelling, no effusion, no ecchymosis, no deformity, no erythema, normal alignment, no LCL laxity, normal patellar mobility, no bony tenderness, normal meniscus and no MCL laxity. No tenderness found. No medial joint line, no lateral joint line, no MCL, no LCL and no patellar tendon tenderness noted.  Slight bulge behind left knee.  - No calf pain/redness/warmth   Neurological: She is alert and oriented to person, place, and time.  Skin: Skin is  warm and dry. She is not diaphoretic.  Psychiatric: She has a normal mood and affect. Her behavior is normal. Judgment and thought content normal.  Nursing note and vitals reviewed.     Assessment & Plan:  1. Acute pain of left knee - possible Bakers Cyst -Discussed risks and benefits of corticosteroid injection and patient consented.  After prepping skin with betadine, injected 80 mg depomedrol and 2 cc of plain xylocaine with 22 gauge one and  one half inch needle using anterolateral approach and pt tolerated well. - Follow up if no improvement    2. Upper respiratory tract infection, unspecified type - Seems to be improving doubt bacterial  - Continue with Flonase  - Can use Afrin but for not greater than 3 days  - Follow up if no improvement in the next 2-3 days or sooner if symptoms become worse    Dorothyann Peng, NP

## 2018-08-11 ENCOUNTER — Other Ambulatory Visit: Payer: Self-pay | Admitting: Family Medicine

## 2018-08-11 NOTE — Telephone Encounter (Signed)
Cory, I do not see that you have filled these in the past.  Please advise.

## 2018-08-14 NOTE — Telephone Encounter (Signed)
I am not sure if it was her old PCP or someone else, lets find out.

## 2018-08-19 ENCOUNTER — Telehealth: Payer: Self-pay | Admitting: Adult Health

## 2018-08-19 NOTE — Telephone Encounter (Signed)
Pt requesting refills of Topamax 100 mg tablet and Flexeril 10 mg tab which were previously filled by historical provider. Pt would like for prescriptions to be sent to [facility].   LOV:07/19/18 PCP: Janna Arch

## 2018-08-19 NOTE — Telephone Encounter (Unsigned)
Copied from [place] [id number]. Topic: Quick Communication - Rx Refill/Question >> Aug 19, 2018  3:27 PM Neva Seat wrote: topiramate (TOPAMAX) 100 MG tablet cyclobenzaprine (FLEXERIL) 10 MG tablet  Needing Rx approval for pt's Rx to be filled.  ref # [id number]  [facility] - [phone]

## 2018-08-22 MED ORDER — TOPIRAMATE 100 MG PO TABS: 100.0000 mg | ORAL_TABLET | Freq: Every evening | ORAL | 3 refills | Status: DC

## 2018-08-22 MED ORDER — CYCLOBENZAPRINE HCL 10 MG PO TABS: 20.0000 mg | ORAL_TABLET | Freq: Every evening | ORAL | 0 refills | Status: DC

## 2018-08-22 NOTE — Telephone Encounter (Signed)
Cory please advise of the refills pt is requesting.    Last OV was 07/19/2018. These meds do not look like they have been filled by you in the past.  Thanks

## 2018-08-22 NOTE — Telephone Encounter (Signed)
Called and spoke with pt and she stated that the [facility] was seeing and managing her migraines and then he stopped treating those.  She stated that she spoke with Tommi Rumps before she switched over and he advised that he would be able to fill these meds for her.  She is needing the topamax and the flexeril  Sent to [facility] since she can get this medication for free.  Cory please advise. Thanks

## 2018-08-26 ENCOUNTER — Ambulatory Visit (INDEPENDENT_AMBULATORY_CARE_PROVIDER_SITE_OTHER): Payer: Medicare Other | Admitting: Adult Health

## 2018-08-26 ENCOUNTER — Encounter: Payer: Self-pay | Admitting: Adult Health

## 2018-08-26 ENCOUNTER — Ambulatory Visit (INDEPENDENT_AMBULATORY_CARE_PROVIDER_SITE_OTHER): Payer: Medicare Other

## 2018-08-26 VITALS — BP 150/80 | Temp 97.9°F | Wt 182.0 lb

## 2018-08-26 DIAGNOSIS — M25562 Pain in left knee: Secondary | ICD-10-CM | POA: Diagnosis not present

## 2018-08-26 NOTE — Progress Notes (Signed)
Subjective:    Patient ID: Lisa Morris, female    DOB: 1951-09-07, 67 y.o.   MRN: [id number]  HPI  67 year old female who  has a past medical history of Arthritis, Chicken pox, Complication of anesthesia, Headache(784.0), Primary osteoarthritis of right shoulder (12/19/2012), and Seasonal allergies.  She presents to the office today for follow-up of left knee pain.  She was seen about a month ago time the knee pain had been present for approximately 2 months.  Pain was worse with certain movements such as twisting and bending, and when laying in bed on her stomach pain was always located behind the knee.  At this time she denied any redness, warmth, or calf pain.  Baker's cyst was present was given [place] 80 mg cortisone injection into the left knee.  Patient reports that her left knee pain resolved for approximately 2 weeks but then returned.  Pain does not appear to be as severe as it was previously.  Pain is worse with a squatting motion and with weightbearing.  He does have some discomfort in the back of her knee but now is mostly located on the inside aspect of her knee.  Again she denies any trauma.  Review of Systems See HPI   Past Medical History:  Diagnosis Date  . Arthritis   . Chicken pox   . Complication of anesthesia   . Headache(784.0)    Takes Maxlt  . Primary osteoarthritis of right shoulder 12/19/2012  . Seasonal allergies     Social History   Socioeconomic History  . Marital status: Married    Spouse name: Not on file  . Number of children: Not on file  . Years of education: Not on file  . Highest education level: Not on file  Occupational History  . Not on file  Social Needs  . Financial resource strain: Not on file  . Food insecurity:    Worry: Not on file    Inability: Not on file  . Transportation needs:    Medical: Not on file    Non-medical: Not on file  Tobacco Use  . Smoking status: Never Smoker  . Smokeless tobacco: Never Used  Substance and  Sexual Activity  . Alcohol use: Yes    Alcohol/week: 8.0 standard drinks    Types: 8 Glasses of wine per week  . Drug use: No  . Sexual activity: Not on file  Lifestyle  . Physical activity:    Days per week: Not on file    Minutes per session: Not on file  . Stress: Not on file  Relationships  . Social connections:    Talks on phone: Not on file    Gets together: Not on file    Attends religious service: Not on file    Active member of club or organization: Not on file    Attends meetings of clubs or organizations: Not on file    Relationship status: Not on file  . Intimate partner violence:    Fear of current or ex partner: Not on file    Emotionally abused: Not on file    Physically abused: Not on file    Forced sexual activity: Not on file  Other Topics Concern  . Not on file  Social History Narrative   Married     Past Surgical History:  Procedure Laterality Date  . [facility]    . LIPOSUCTION     Neck  . TOTAL SHOULDER ARTHROPLASTY  12/19/2012  Procedure: TOTAL SHOULDER ARTHROPLASTY;  Surgeon: [name], MD;  Location: [place];  Service: Orthopedics;  Laterality: Right;    Family History  Problem Relation Age of Onset  . Arthritis Mother   . Hearing loss Mother   . Hypertension Mother   . Hearing loss Father   . Hyperlipidemia Father   . Hypertension Father   . Stroke Father   . Heart disease Maternal Grandmother   . Rheumatic fever Maternal Grandmother     No Known Allergies  Current Outpatient Medications on File Prior to Visit  Medication Sig Dispense Refill  . atorvastatin (LIPITOR) 10 MG tablet Take 1 tablet (10 mg total) by mouth daily. 90 tablet 3  . cyclobenzaprine (FLEXERIL) 10 MG tablet Take 2 tablets (20 mg total) by mouth every evening. 180 tablet 0  . fluticasone (FLONASE) 50 MCG/ACT nasal spray Place into both nostrils daily.    . MULTIPLE VITAMIN PO SENTRY SENIOR - 1 TAB DAILY    . promethazine (PHENERGAN) 25 MG tablet Take 1  tablet (25 mg total) by mouth every 6 (six) hours as needed for nausea. 30 tablet 0  . rizatriptan (MAXALT) 10 MG tablet Take 10 mg by mouth as needed. May repeat in 2 hours if needed For migraines    . topiramate (TOPAMAX) 100 MG tablet Take 1 tablet (100 mg total) by mouth every evening. 90 tablet 3  . venlafaxine XR (EFFEXOR-XR) 75 MG 24 hr capsule Take 3 capsules (225 mg total) by mouth at bedtime. Take 3 tablets by mouth at bedtime 270 capsule 3   No current facility-administered medications on file prior to visit.     BP (!) 150/80   Temp 97.9 F (36.6 C) (Oral)   Wt 182 lb (82.6 kg)   BMI 33.56 kg/m       Objective:   Physical Exam  Constitutional: She is oriented to person, place, and time. She appears well-developed and well-nourished. No distress.  Cardiovascular: Normal rate, regular rhythm, normal heart sounds and intact distal pulses.  Pulmonary/Chest: Effort normal and breath sounds normal.  Musculoskeletal: She exhibits tenderness. She exhibits no edema or deformity.       Left knee: She exhibits normal range of motion, no swelling, no effusion, normal alignment, no LCL laxity, normal patellar mobility, no bony tenderness and no MCL laxity. Tenderness found. MCL tenderness noted. No medial joint line, no lateral joint line, no LCL and no patellar tendon tenderness noted.  No Baker's cyst noted No discomfort with straight leg raise, knee-to-chest, internal or external rotation.negative posterior drawer She is able to squat without difficulty or pain Slight tenderness along MCL  Neurological: She is alert and oriented to person, place, and time.  Skin: Skin is warm and dry. Capillary refill takes less than 2 seconds. She is not diaphoretic.  Psychiatric: She has a normal mood and affect. Her behavior is normal. Judgment and thought content normal.  Nursing note and vitals reviewed.     Assessment & Plan:  1. Acute pain of left knee -Concern for ACL, MCL, or patellar  tendon tear.  No concern for meniscus injury.  She may have some inflammation of the MCL.  Get x-ray to look at internal structure of her left knee.  She was advised to use Nsaids every 6 hours over the weekend.  We will follow-up once x-ray has resulted - DG Knee Complete 4 Views Left; Future  Dorothyann Peng, NP

## 2018-10-06 ENCOUNTER — Other Ambulatory Visit: Payer: Self-pay | Admitting: Adult Health

## 2018-10-10 DIAGNOSIS — M179 Osteoarthritis of knee, unspecified: Secondary | ICD-10-CM | POA: Insufficient documentation

## 2018-10-10 DIAGNOSIS — M1710 Unilateral primary osteoarthritis, unspecified knee: Secondary | ICD-10-CM | POA: Insufficient documentation

## 2018-12-28 HISTORY — PX: KNEE ARTHROSCOPY: SHX127

## 2019-02-28 ENCOUNTER — Ambulatory Visit (INDEPENDENT_AMBULATORY_CARE_PROVIDER_SITE_OTHER): Payer: Medicare Other | Admitting: Adult Health

## 2019-02-28 ENCOUNTER — Encounter: Payer: Self-pay | Admitting: Adult Health

## 2019-02-28 VITALS — BP 124/84 | Temp 98.2°F | Wt 182.0 lb

## 2019-02-28 DIAGNOSIS — B9789 Other viral agents as the cause of diseases classified elsewhere: Secondary | ICD-10-CM | POA: Diagnosis not present

## 2019-02-28 DIAGNOSIS — Z1231 Encounter for screening mammogram for malignant neoplasm of breast: Secondary | ICD-10-CM | POA: Diagnosis not present

## 2019-02-28 DIAGNOSIS — Z1211 Encounter for screening for malignant neoplasm of colon: Secondary | ICD-10-CM | POA: Diagnosis not present

## 2019-02-28 DIAGNOSIS — J329 Chronic sinusitis, unspecified: Secondary | ICD-10-CM

## 2019-02-28 NOTE — Progress Notes (Signed)
Subjective:    Patient ID: Lisa Morris, female    DOB: 02-08-1951, 68 y.o.   MRN: [id number]  Sinusitis  This is a new problem. The current episode started in the past 7 days (4 days ). There has been no fever. Associated symptoms include congestion, ear pain, headaches and sinus pressure. Pertinent negatives include no coughing, hoarse voice, shortness of breath, sore throat or swollen glands. Past treatments include spray decongestants. The treatment provided mild relief.   She would also like to be scheduled for her colonoscopy and mammogram   Review of Systems  Constitutional: Negative.   HENT: Positive for congestion, ear pain, rhinorrhea, sinus pressure and sinus pain. Negative for hoarse voice, sore throat, tinnitus and trouble swallowing.   Respiratory: Negative for cough, chest tightness and shortness of breath.   Cardiovascular: Negative.   Neurological: Positive for headaches.   Past Medical History:  Diagnosis Date  . Arthritis   . Chicken pox   . Complication of anesthesia   . Headache(784.0)    Takes Maxlt  . Primary osteoarthritis of right shoulder 12/19/2012  . Seasonal allergies     Social History   Socioeconomic History  . Marital status: Married    Spouse name: Not on file  . Number of children: Not on file  . Years of education: Not on file  . Highest education level: Not on file  Occupational History  . Not on file  Social Needs  . Financial resource strain: Not on file  . Food insecurity:    Worry: Not on file    Inability: Not on file  . Transportation needs:    Medical: Not on file    Non-medical: Not on file  Tobacco Use  . Smoking status: Never Smoker  . Smokeless tobacco: Never Used  Substance and Sexual Activity  . Alcohol use: Yes    Alcohol/week: 8.0 standard drinks    Types: 8 Glasses of wine per week  . Drug use: No  . Sexual activity: Not on file  Lifestyle  . Physical activity:    Days per week: Not on file    Minutes  per session: Not on file  . Stress: Not on file  Relationships  . Social connections:    Talks on phone: Not on file    Gets together: Not on file    Attends religious service: Not on file    Active member of club or organization: Not on file    Attends meetings of clubs or organizations: Not on file    Relationship status: Not on file  . Intimate partner violence:    Fear of current or ex partner: Not on file    Emotionally abused: Not on file    Physically abused: Not on file    Forced sexual activity: Not on file  Other Topics Concern  . Not on file  Social History Narrative   Married     Past Surgical History:  Procedure Laterality Date  . [facility]    . LIPOSUCTION     Neck  . TOTAL SHOULDER ARTHROPLASTY  12/19/2012   Procedure: TOTAL SHOULDER ARTHROPLASTY;  Surgeon: [name], MD;  Location: [facility];  Service: Orthopedics;  Laterality: Right;    Family History  Problem Relation Age of Onset  . Arthritis Mother   . Hearing loss Mother   . Hypertension Mother   . Hearing loss Father   . Hyperlipidemia Father   . Hypertension Father   .  Stroke Father   . Heart disease Maternal Grandmother   . Rheumatic fever Maternal Grandmother     No Known Allergies  Current Outpatient Medications on File Prior to Visit  Medication Sig Dispense Refill  . atorvastatin (LIPITOR) 10 MG tablet Take 1 tablet (10 mg total) by mouth daily. 90 tablet 3  . cyclobenzaprine (FLEXERIL) 10 MG tablet TAKE 2 TABLETS BY MOUTH  EVERY EVENING 180 tablet 1  . fluticasone (FLONASE) 50 MCG/ACT nasal spray Place into both nostrils daily.    . promethazine (PHENERGAN) 25 MG tablet Take 1 tablet (25 mg total) by mouth every 6 (six) hours as needed for nausea. 30 tablet 0  . rizatriptan (MAXALT) 10 MG tablet Take 10 mg by mouth as needed. May repeat in 2 hours if needed For migraines    . topiramate (TOPAMAX) 100 MG tablet Take 1 tablet (100 mg total) by mouth every evening. 90 tablet 3  .  venlafaxine XR (EFFEXOR-XR) 75 MG 24 hr capsule Take 3 capsules (225 mg total) by mouth at bedtime. Take 3 tablets by mouth at bedtime 270 capsule 3   No current facility-administered medications on file prior to visit.     BP 124/84   Temp 98.2 F (36.8 C)   Wt 182 lb (82.6 kg)   BMI 33.56 kg/m       Objective:   Physical Exam Vitals signs and nursing note reviewed.  Constitutional:      Appearance: Normal appearance.  HENT:     Right Ear: No middle ear effusion. Tympanic membrane is not retracted or bulging.     Left Ear: A middle ear effusion is present. Tympanic membrane is not bulging.     Nose: Rhinorrhea present. No mucosal edema or congestion. Rhinorrhea is clear.     Right Turbinates: Enlarged.     Left Turbinates: Enlarged.     Right Sinus: No maxillary sinus tenderness or frontal sinus tenderness.     Left Sinus: No maxillary sinus tenderness or frontal sinus tenderness.     Mouth/Throat:     Pharynx: Oropharynx is clear. No pharyngeal swelling, oropharyngeal exudate or posterior oropharyngeal erythema.  Neurological:     Mental Status: She is alert.       Assessment & Plan:  1. Viral sinusitis - use Flonase  - Follow up if no improvement in the next 4-5 days or sooner if symptoms worsen   2. Colon cancer screening  - Ambulatory referral to Gastroenterology  3. Screening mammogram, encounter for  - MM DIGITAL SCREENING BILATERAL; Future   [employer]

## 2019-03-10 ENCOUNTER — Other Ambulatory Visit: Payer: Self-pay | Admitting: Adult Health

## 2019-03-10 DIAGNOSIS — Z1231 Encounter for screening mammogram for malignant neoplasm of breast: Secondary | ICD-10-CM

## 2019-03-29 ENCOUNTER — Encounter: Payer: Self-pay | Admitting: Adult Health

## 2019-04-19 ENCOUNTER — Other Ambulatory Visit: Payer: Self-pay | Admitting: Adult Health

## 2019-04-19 ENCOUNTER — Encounter: Payer: Self-pay | Admitting: Adult Health

## 2019-04-20 MED ORDER — RIZATRIPTAN BENZOATE 10 MG PO TABS: 10.0000 mg | ORAL_TABLET | ORAL | 0 refills | Status: DC | PRN

## 2019-04-20 MED ORDER — VENLAFAXINE HCL ER 75 MG PO CP24: 225.0000 mg | ORAL_CAPSULE | Freq: Every day | ORAL | 0 refills | Status: DC

## 2019-04-20 NOTE — Addendum Note (Signed)
Addended by: Miles Costain T on: 04/20/2019 10:07 AM   Modules accepted: Orders

## 2019-07-21 ENCOUNTER — Other Ambulatory Visit: Payer: Self-pay | Admitting: Adult Health

## 2019-07-21 ENCOUNTER — Encounter: Payer: Self-pay | Admitting: Adult Health

## 2019-07-25 NOTE — Telephone Encounter (Signed)
90 day supply sent to the pharmacy. 

## 2019-08-29 ENCOUNTER — Encounter: Payer: Self-pay | Admitting: Adult Health

## 2019-08-29 ENCOUNTER — Other Ambulatory Visit: Payer: Self-pay

## 2019-08-29 ENCOUNTER — Ambulatory Visit (INDEPENDENT_AMBULATORY_CARE_PROVIDER_SITE_OTHER): Payer: Medicare Other | Admitting: Adult Health

## 2019-08-29 VITALS — BP 124/86 | Temp 98.2°F | Ht 61.0 in | Wt 179.0 lb

## 2019-08-29 DIAGNOSIS — Z0000 Encounter for general adult medical examination without abnormal findings: Secondary | ICD-10-CM

## 2019-08-29 DIAGNOSIS — E782 Mixed hyperlipidemia: Secondary | ICD-10-CM | POA: Diagnosis not present

## 2019-08-29 DIAGNOSIS — G43801 Other migraine, not intractable, with status migrainosus: Secondary | ICD-10-CM

## 2019-08-29 DIAGNOSIS — Z23 Encounter for immunization: Secondary | ICD-10-CM

## 2019-08-29 DIAGNOSIS — E785 Hyperlipidemia, unspecified: Secondary | ICD-10-CM | POA: Insufficient documentation

## 2019-08-29 LAB — COMPREHENSIVE METABOLIC PANEL
ALT: 12 U/L (ref 0–35)
AST: 15 U/L (ref 0–37)
Albumin: 4.4 g/dL (ref 3.5–5.2)
Alkaline Phosphatase: 67 U/L (ref 39–117)
BUN: 21 mg/dL (ref 6–23)
CO2: 28 mEq/L (ref 19–32)
Calcium: 9.2 mg/dL (ref 8.4–10.5)
Chloride: 107 mEq/L (ref 96–112)
Creatinine, Ser: 0.87 mg/dL (ref 0.40–1.20)
GFR: 65.01 mL/min (ref >60.00)
Glucose, Bld: 82 mg/dL (ref 70–99)
Potassium: 4.1 mEq/L (ref 3.5–5.1)
Sodium: 142 mEq/L (ref 135–145)
Total Bilirubin: 0.6 mg/dL (ref 0.2–1.2)
Total Protein: 7.1 g/dL (ref 6.0–8.3)

## 2019-08-29 LAB — LIPID PANEL
Cholesterol: 172 mg/dL (ref 0–200)
HDL: 81.6 mg/dL (ref >39.00)
LDL Cholesterol: 72 mg/dL (ref 0–99)
NonHDL: 90.34
Total CHOL/HDL Ratio: 2
Triglycerides: 92 mg/dL (ref 0.0–149.0)
VLDL: 18.4 mg/dL (ref 0.0–40.0)

## 2019-08-29 LAB — CBC WITH DIFFERENTIAL/PLATELET
Basophils Absolute: 0.1 10*3/uL (ref 0.0–0.1)
Basophils Relative: 1.1 % (ref 0.0–3.0)
Eosinophils Absolute: 0.6 10*3/uL (ref 0.0–0.7)
Eosinophils Relative: 7 % — ABNORMAL HIGH (ref 0.0–5.0)
HCT: 42.5 % (ref 36.0–46.0)
Hemoglobin: 14.1 g/dL (ref 12.0–15.0)
Lymphocytes Relative: 26.1 % (ref 12.0–46.0)
Lymphs Abs: 2.2 10*3/uL (ref 0.7–4.0)
MCHC: 33.2 g/dL (ref 30.0–36.0)
MCV: 87.2 fl (ref 78.0–100.0)
Monocytes Absolute: 0.6 10*3/uL (ref 0.1–1.0)
Monocytes Relative: 6.9 % (ref 3.0–12.0)
Neutro Abs: 4.9 10*3/uL (ref 1.4–7.7)
Neutrophils Relative %: 58.9 % (ref 43.0–77.0)
Platelets: 201 10*3/uL (ref 150.0–400.0)
RBC: 4.87 Mil/uL (ref 3.87–5.11)
RDW: 14.5 % (ref 11.5–15.5)
WBC: 8.3 10*3/uL (ref 4.0–10.5)

## 2019-08-29 LAB — TSH: TSH: 2.33 u[IU]/mL (ref 0.35–4.50)

## 2019-08-29 MED ORDER — TOPIRAMATE 100 MG PO TABS: 100.0000 mg | ORAL_TABLET | Freq: Every evening | ORAL | 3 refills | Status: DC

## 2019-08-29 NOTE — Addendum Note (Signed)
Addended by: Miles Costain T on: 08/29/2019 08:40 AM   Modules accepted: Orders

## 2019-08-29 NOTE — Patient Instructions (Addendum)
It was great seeing you today   We will follow up with you regarding your blood work   Please call [facility] and schedule your colonoscopy -  Phone: [phone]   Also call to schedule your mammogram - [facility].

## 2019-08-29 NOTE — Progress Notes (Signed)
Subjective:    Patient ID: Lisa Morris, female    DOB: [birth year], 68 y.o.   MRN: [id number]  HPI Patient presents for yearly preventative medicine examination. She is a pleasant 68 year old female who  has a past medical history of Arthritis, Chicken pox, Complication of anesthesia, Headache(784.0), Primary osteoarthritis of right shoulder (12/19/2012), and Seasonal allergies.  Migraines - Takes Topmax 100 mg and Effexor 225 mg daily. She also takes MAxalt and Flexeril PRN. Her migraines are well controlled.   Hyperlipidemia - Currently prescribed Lipitor 10 mg daily. She denies myalgias or fatigue   All immunizations and health maintenance protocols were reviewed with the patient and needed orders were placed.  Appropriate screening laboratory values were ordered for the patient including screening of hyperlipidemia, renal function and hepatic function.  Medication reconciliation,  past medical history, social history, problem list and allergies were reviewed in detail with the patient  Goals were established with regard to weight loss, exercise, and  diet in compliance with medications. She does not exercise on a routine basis. She tries to eat a heart healthy diet and does not eat a lot of processed foods.   End of life planning was discussed.  She is due for routine screening colonoscopy- referral was placed but she never called back to make an appointment. Her screening mammogram was also ordered but she has not had it done yet.    Review of Systems  Constitutional: Negative.   HENT: Negative.   Eyes: Negative.   Respiratory: Negative.   Cardiovascular: Negative.   Gastrointestinal: Negative.   Endocrine: Negative.   Genitourinary: Negative.   Musculoskeletal: Negative.   Skin: Negative.   Allergic/Immunologic: Negative.   Neurological: Negative.   Hematological: Negative.   Psychiatric/Behavioral: Negative.    Past Medical History:  Diagnosis Date  . Arthritis    . Chicken pox   . Complication of anesthesia   . Headache(784.0)    Takes Maxlt  . Primary osteoarthritis of right shoulder 12/19/2012  . Seasonal allergies     Social History   Socioeconomic History  . Marital status: Married    Spouse name: Not on file  . Number of children: Not on file  . Years of education: Not on file  . Highest education level: Not on file  Occupational History  . Not on file  Social Needs  . Financial resource strain: Not on file  . Food insecurity    Worry: Not on file    Inability: Not on file  . Transportation needs    Medical: Not on file    Non-medical: Not on file  Tobacco Use  . Smoking status: Never Smoker  . Smokeless tobacco: Never Used  Substance and Sexual Activity  . Alcohol use: Yes    Alcohol/week: 8.0 standard drinks    Types: 8 Glasses of wine per week  . Drug use: No  . Sexual activity: Not on file  Lifestyle  . Physical activity    Days per week: Not on file    Minutes per session: Not on file  . Stress: Not on file  Relationships  . Social Herbalist on phone: Not on file    Gets together: Not on file    Attends religious service: Not on file    Active member of club or organization: Not on file    Attends meetings of clubs or organizations: Not on file    Relationship status: Not on file  .  Intimate partner violence    Fear of current or ex partner: Not on file    Emotionally abused: Not on file    Physically abused: Not on file    Forced sexual activity: Not on file  Other Topics Concern  . Not on file  Social History Narrative   Married     Past Surgical History:  Procedure Laterality Date  . [facility]    . LIPOSUCTION     Neck  . TOTAL SHOULDER ARTHROPLASTY  12/19/2012   Procedure: TOTAL SHOULDER ARTHROPLASTY;  Surgeon: [name], MD;  Location: [facility];  Service: Orthopedics;  Laterality: Right;    Family History  Problem Relation Age of Onset  . Arthritis Mother   . Hearing loss  Mother   . Hypertension Mother   . Hearing loss Father   . Hyperlipidemia Father   . Hypertension Father   . Stroke Father   . Heart disease Maternal Grandmother   . Rheumatic fever Maternal Grandmother     No Known Allergies  Current Outpatient Medications on File Prior to Visit  Medication Sig Dispense Refill  . atorvastatin (LIPITOR) 10 MG tablet Take 1 tablet (10 mg total) by mouth daily. 90 tablet 3  . cyclobenzaprine (FLEXERIL) 10 MG tablet TAKE 2 TABLETS BY MOUTH  EVERY EVENING 180 tablet 1  . fluticasone (FLONASE) 50 MCG/ACT nasal spray Place into both nostrils daily.    . promethazine (PHENERGAN) 25 MG tablet Take 1 tablet (25 mg total) by mouth every 6 (six) hours as needed for nausea. 30 tablet 0  . rizatriptan (MAXALT) 10 MG tablet Take 1 tablet (10 mg total) by mouth as needed. May repeat in 2 hours if needed 30 tablet 0  . topiramate (TOPAMAX) 100 MG tablet Take 1 tablet (100 mg total) by mouth every evening. 90 tablet 3  . venlafaxine XR (EFFEXOR-XR) 75 MG 24 hr capsule TAKE 3 CAPSULES BY MOUTH AT BEDTIME 270 capsule 0   No current facility-administered medications on file prior to visit.     There were no vitals taken for this visit.      Objective:   Physical Exam Vitals signs and nursing note reviewed.  Constitutional:      General: She is not in acute distress.    Appearance: Normal appearance. She is normal weight. She is not diaphoretic.  HENT:     Head: Normocephalic and atraumatic.     Right Ear: Tympanic membrane, ear canal and external ear normal. There is no impacted cerumen.     Left Ear: Tympanic membrane, ear canal and external ear normal. There is no impacted cerumen.     Nose: Nose normal. No congestion or rhinorrhea.     Mouth/Throat:     Mouth: Mucous membranes are dry.     Pharynx: Oropharynx is clear. No oropharyngeal exudate or posterior oropharyngeal erythema.  Eyes:     General: No scleral icterus.       Right eye: No discharge.         Left eye: No discharge.     Extraocular Movements: Extraocular movements intact.     Conjunctiva/sclera: Conjunctivae normal.     Pupils: Pupils are equal, round, and reactive to light.  Neck:     Musculoskeletal: Normal range of motion and neck supple.     Thyroid: No thyromegaly.     Vascular: No carotid bruit or JVD.     Trachea: No tracheal deviation.  Cardiovascular:     Rate  and Rhythm: Normal rate and regular rhythm.     Pulses: Normal pulses.     Heart sounds: Normal heart sounds. No murmur. No friction rub. No gallop.   Pulmonary:     Effort: Pulmonary effort is normal. No respiratory distress.     Breath sounds: Normal breath sounds. No stridor. No wheezing, rhonchi or rales.  Chest:     Chest wall: No tenderness.  Abdominal:     General: Bowel sounds are normal. There is no distension.     Palpations: Abdomen is soft. There is no mass.     Tenderness: There is no abdominal tenderness. There is no right CVA tenderness, left CVA tenderness, guarding or rebound.     Hernia: No hernia is present.  Musculoskeletal: Normal range of motion.        General: No swelling, tenderness, deformity or signs of injury.     Right lower leg: No edema.     Left lower leg: No edema.  Lymphadenopathy:     Cervical: No cervical adenopathy.  Skin:    General: Skin is warm and dry.     Capillary Refill: Capillary refill takes less than 2 seconds.     Coloration: Skin is not jaundiced or pale.     Findings: No bruising, erythema, lesion or rash.  Neurological:     General: No focal deficit present.     Mental Status: She is alert and oriented to person, place, and time. Mental status is at baseline.     Cranial Nerves: No cranial nerve deficit.     Sensory: No sensory deficit.     Motor: No weakness or abnormal muscle tone.     Coordination: Coordination normal.     Gait: Gait normal.     Deep Tendon Reflexes: Reflexes normal.  Psychiatric:        Mood and Affect: Mood normal.         Thought Content: Thought content normal.        Judgment: Judgment normal.       Assessment & Plan:  1. Routine general medical examination at a health care facility - Encouraged to start walking and eating a heart healthy diet  - Follow up with mammogram and colonoscopy  - Follow up for CPE in 1 year  - CBC with Differential/Platelet - Comprehensive metabolic panel - Lipid panel - TSH  2. Other migraine with status migrainosus, not intractable - Well controlled.  - No change in medications  - CBC with Differential/Platelet - Comprehensive metabolic panel - Lipid panel - TSH - topiramate (TOPAMAX) 100 MG tablet; Take 1 tablet (100 mg total) by mouth every evening.  Dispense: 90 tablet; Refill: 3  3. Mixed hyperlipidemia - Consider increase in statin  - CBC with Differential/Platelet - Comprehensive metabolic panel - Lipid panel - TSH  Dorothyann Peng, NP

## 2019-09-12 ENCOUNTER — Other Ambulatory Visit: Payer: Self-pay | Admitting: Adult Health

## 2019-10-27 ENCOUNTER — Encounter: Payer: Self-pay | Admitting: Adult Health

## 2019-10-27 ENCOUNTER — Other Ambulatory Visit: Payer: Self-pay | Admitting: Adult Health

## 2019-10-27 MED ORDER — RIZATRIPTAN BENZOATE 10 MG PO TABS: 10.0000 mg | ORAL_TABLET | ORAL | 0 refills | Status: DC | PRN

## 2019-10-27 NOTE — Telephone Encounter (Signed)
Sent to the pharmacy by e-scribe. 

## 2019-11-02 ENCOUNTER — Encounter: Payer: Self-pay | Admitting: Adult Health

## 2019-12-15 ENCOUNTER — Other Ambulatory Visit: Payer: Self-pay | Admitting: Adult Health

## 2019-12-19 NOTE — Telephone Encounter (Signed)
Sent to the pharmacy by e-scribe. 

## 2020-03-04 ENCOUNTER — Other Ambulatory Visit: Payer: Self-pay

## 2020-03-05 ENCOUNTER — Telehealth (INDEPENDENT_AMBULATORY_CARE_PROVIDER_SITE_OTHER): Payer: Medicare Other | Admitting: Adult Health

## 2020-03-05 ENCOUNTER — Encounter: Payer: Self-pay | Admitting: Adult Health

## 2020-03-05 DIAGNOSIS — G43801 Other migraine, not intractable, with status migrainosus: Secondary | ICD-10-CM | POA: Diagnosis not present

## 2020-03-05 DIAGNOSIS — J301 Allergic rhinitis due to pollen: Secondary | ICD-10-CM | POA: Diagnosis not present

## 2020-03-05 MED ORDER — PROMETHAZINE HCL 25 MG PO TABS: 25.0000 mg | ORAL_TABLET | Freq: Four times a day (QID) | ORAL | 0 refills | Status: DC | PRN

## 2020-03-05 NOTE — Progress Notes (Signed)
Virtual Visit via Video Note  I connected with Haynes Hoehn on 03/05/20 at 10:30 AM EST by a video enabled telemedicine application and verified that I am speaking with the correct person using two identifiers.  Location patient: home Location provider:work or home office Persons participating in the virtual visit: patient, provider  * Video failed during this visit   I discussed the limitations of evaluation and management by telemedicine and the availability of in person appointments. The patient expressed understanding and agreed to proceed.   HPI: 69 year old female who is being evaluated today virtually for allergic rhinitis.  This is a chronic issue for her that is worse in the spring.  She reports postnasal drip, sinus pain and pressure, and rhinorrhea.  Reports that some days are better than others.  She has been using Flonase on a regular basis but is not currently finding this helpful.  She denies fevers, chills, feeling acutely ill.  She has also been using a nasal wash helps with symptoms for a short period of time  She also needs a refill of Phenergan that she uses sparingly for nausea when she has a bad migraine headache  ROS: See pertinent positives and negatives per HPI.  Past Medical History:  Diagnosis Date  . Arthritis   . Chicken pox   . Complication of anesthesia   . Headache(784.0)    Takes Maxlt  . Primary osteoarthritis of right shoulder 12/19/2012  . Seasonal allergies     Past Surgical History:  Procedure Laterality Date  . [facility]    . LIPOSUCTION     Neck  . TOTAL SHOULDER ARTHROPLASTY  12/19/2012   Procedure: TOTAL SHOULDER ARTHROPLASTY;  Surgeon: [name], MD;  Location: [facility];  Service: Orthopedics;  Laterality: Right;    Family History  Problem Relation Age of Onset  . Arthritis Mother   . Hearing loss Mother   . Hypertension Mother   . Hearing loss Father   . Hyperlipidemia Father   . Hypertension Father   . Stroke Father   .  Heart disease Maternal Grandmother   . Rheumatic fever Maternal Grandmother        Current Outpatient Medications:  .  atorvastatin (LIPITOR) 10 MG tablet, TAKE 1 TABLET BY MOUTH  DAILY, Disp: 90 tablet, Rfl: 3 .  cyclobenzaprine (FLEXERIL) 10 MG tablet, TAKE 2 TABLETS BY MOUTH  EVERY EVENING, Disp: 180 tablet, Rfl: 0 .  fluticasone (FLONASE) 50 MCG/ACT nasal spray, Place into both nostrils daily., Disp: , Rfl:  .  promethazine (PHENERGAN) 25 MG tablet, Take 1 tablet (25 mg total) by mouth every 6 (six) hours as needed for nausea., Disp: 30 tablet, Rfl: 0 .  rizatriptan (MAXALT) 10 MG tablet, Take 1 tablet (10 mg total) by mouth as needed. May repeat in 2 hours if needed, Disp: 10 tablet, Rfl: 0 .  topiramate (TOPAMAX) 100 MG tablet, Take 1 tablet (100 mg total) by mouth every evening., Disp: 90 tablet, Rfl: 3 .  venlafaxine XR (EFFEXOR-XR) 75 MG 24 hr capsule, TAKE 3 CAPSULES BY MOUTH AT BEDTIME, Disp: 270 capsule, Rfl: 2  EXAM:  VITALS per patient if applicable:  GENERAL: alert, oriented, appears well and in no acute distress  HEENT: atraumatic, conjunttiva clear, no obvious abnormalities on inspection of external nose and ears  NECK: normal movements of the head and neck  LUNGS: on inspection no signs of respiratory distress, breathing rate appears normal, no obvious gross SOB, gasping or wheezing  CV: no  obvious cyanosis  MS: moves all visible extremities without noticeable abnormality  PSYCH/NEURO: pleasant and cooperative, no obvious depression or anxiety, speech and thought processing grossly intact  ASSESSMENT AND PLAN:  Discussed the following assessment and plan:  1. Other migraine with status migrainosus, not intractable  - promethazine (PHENERGAN) 25 MG tablet; Take 1 tablet (25 mg total) by mouth every 6 (six) hours as needed for nausea.  Dispense: 30 tablet; Refill: 0  2. Seasonal allergic rhinitis due to pollen -Encouraged to try using oral allergy medication  sheets of Zyrtec.  Vies to follow-up if no improvement in allergy symptoms in 4 weeks.  Consider referring to allergy specialist     I discussed the assessment and treatment plan with the patient. The patient was provided an opportunity to ask questions and all were answered. The patient agreed with the plan and demonstrated an understanding of the instructions.   The patient was advised to call back or seek an in-person evaluation if the symptoms worsen or if the condition fails to improve as anticipated.   Dorothyann Peng, NP

## 2020-03-05 NOTE — Progress Notes (Deleted)
Subjective:    Patient ID: Lisa Morris, female    DOB: 03-Jun-1951, 69 y.o.   MRN: [id number]  HPI  Patient presents for yearly preventative medicine examination.She s a pleasant 69 year old who  has a past medical history of Arthritis, Chicken pox, Complication of anesthesia, Headache(784.0), Primary osteoarthritis of right shoulder (12/19/2012), and Seasonal allergies.  Hyperlipidemia -currently prescribed Lipitor 10 mg daily.  She denies myalgia or fatigue Lab Results  Component Value Date   CHOL 172 08/29/2019   HDL 81.60 08/29/2019   LDLCALC 72 08/29/2019   TRIG 92.0 08/29/2019   CHOLHDL 2 08/29/2019   Migraines -takes Topamax 100 mg and Effexor 225 mg daily.  She feels as though her migraines are well controlled.  If needed she will take Maxalt and Flexeril as needed.   All immunizations and health maintenance protocols were reviewed with the patient and needed orders were placed.  Appropriate screening laboratory values were ordered for the patient including screening of hyperlipidemia, renal function and hepatic function. If indicated by BPH, a PSA was ordered.  Medication reconciliation,  past medical history, social history, problem list and allergies were reviewed in detail with the patient  Goals were established with regard to weight loss, exercise, and  diet in compliance with medications.  He does not exercise on a routine basis although she does try to eat a heart healthy diet and does not eat a lot of processed foods  She is due for screening mammogram and colonoscopy, orders were placed multiple times but she failed to schedule her appointments   Review of Systems  Constitutional: Negative.   HENT: Negative.   Eyes: Negative.   Respiratory: Negative.   Cardiovascular: Negative.   Gastrointestinal: Negative.   Endocrine: Negative.   Genitourinary: Negative.   Musculoskeletal: Negative.   Skin: Negative.   Allergic/Immunologic: Negative.     Neurological: Negative.   Hematological: Negative.   Psychiatric/Behavioral: Negative.    Past Medical History:  Diagnosis Date  . Arthritis   . Chicken pox   . Complication of anesthesia   . Headache(784.0)    Takes Maxlt  . Primary osteoarthritis of right shoulder 12/19/2012  . Seasonal allergies     Social History   Socioeconomic History  . Marital status: Married    Spouse name: Not on file  . Number of children: Not on file  . Years of education: Not on file  . Highest education level: Not on file  Occupational History  . Not on file  Tobacco Use  . Smoking status: Never Smoker  . Smokeless tobacco: Never Used  Substance and Sexual Activity  . Alcohol use: Yes    Alcohol/week: 8.0 standard drinks    Types: 8 Glasses of wine per week  . Drug use: No  . Sexual activity: Not on file  Other Topics Concern  . Not on file  Social History Narrative   Married    Social Determinants of Health   Financial Resource Strain:   . Difficulty of Paying Living Expenses: Not on file  Food Insecurity:   . Worried About Charity fundraiser in the Last Year: Not on file  . Ran Out of Food in the Last Year: Not on file  Transportation Needs:   . Lack of Transportation (Medical): Not on file  . Lack of Transportation (Non-Medical): Not on file  Physical Activity:   . Days of Exercise per Week: Not on file  . Minutes of Exercise per Session:  Not on file  Stress:   . Feeling of Stress : Not on file  Social Connections:   . Frequency of Communication with Friends and Family: Not on file  . Frequency of Social Gatherings with Friends and Family: Not on file  . Attends Religious Services: Not on file  . Active Member of Clubs or Organizations: Not on file  . Attends Archivist Meetings: Not on file  . Marital Status: Not on file  Intimate Partner Violence:   . Fear of Current or Ex-Partner: Not on file  . Emotionally Abused: Not on file  . Physically Abused: Not  on file  . Sexually Abused: Not on file    Past Surgical History:  Procedure Laterality Date  . [facility]    . LIPOSUCTION     Neck  . TOTAL SHOULDER ARTHROPLASTY  12/19/2012   Procedure: TOTAL SHOULDER ARTHROPLASTY;  Surgeon: [name], MD;  Location: [facility];  Service: Orthopedics;  Laterality: Right;    Family History  Problem Relation Age of Onset  . Arthritis Mother   . Hearing loss Mother   . Hypertension Mother   . Hearing loss Father   . Hyperlipidemia Father   . Hypertension Father   . Stroke Father   . Heart disease Maternal Grandmother   . Rheumatic fever Maternal Grandmother     No Known Allergies  Current Outpatient Medications on File Prior to Visit  Medication Sig Dispense Refill  . atorvastatin (LIPITOR) 10 MG tablet TAKE 1 TABLET BY MOUTH  DAILY 90 tablet 3  . cyclobenzaprine (FLEXERIL) 10 MG tablet TAKE 2 TABLETS BY MOUTH  EVERY EVENING 180 tablet 0  . fluticasone (FLONASE) 50 MCG/ACT nasal spray Place into both nostrils daily.    . promethazine (PHENERGAN) 25 MG tablet Take 1 tablet (25 mg total) by mouth every 6 (six) hours as needed for nausea. 30 tablet 0  . rizatriptan (MAXALT) 10 MG tablet Take 1 tablet (10 mg total) by mouth as needed. May repeat in 2 hours if needed 10 tablet 0  . topiramate (TOPAMAX) 100 MG tablet Take 1 tablet (100 mg total) by mouth every evening. 90 tablet 3  . venlafaxine XR (EFFEXOR-XR) 75 MG 24 hr capsule TAKE 3 CAPSULES BY MOUTH AT BEDTIME 270 capsule 2   No current facility-administered medications on file prior to visit.    There were no vitals taken for this visit.      Objective:   Physical Exam Vitals and nursing note reviewed.  Constitutional:      General: She is not in acute distress.    Appearance: Normal appearance. She is well-developed. She is not ill-appearing.  HENT:     Head: Normocephalic and atraumatic.     Right Ear: Tympanic membrane, ear canal and external ear normal. There is no impacted  cerumen.     Left Ear: Tympanic membrane, ear canal and external ear normal. There is no impacted cerumen.     Nose: Nose normal. No congestion or rhinorrhea.     Mouth/Throat:     Mouth: Mucous membranes are moist.     Pharynx: Oropharynx is clear. No oropharyngeal exudate or posterior oropharyngeal erythema.  Eyes:     General:        Right eye: No discharge.        Left eye: No discharge.     Extraocular Movements: Extraocular movements intact.     Conjunctiva/sclera: Conjunctivae normal.     Pupils: Pupils are  equal, round, and reactive to light.  Neck:     Thyroid: No thyromegaly.     Vascular: No carotid bruit.     Trachea: No tracheal deviation.  Cardiovascular:     Rate and Rhythm: Normal rate and regular rhythm.     Pulses: Normal pulses.     Heart sounds: Normal heart sounds. No murmur. No friction rub. No gallop.   Pulmonary:     Effort: Pulmonary effort is normal. No respiratory distress.     Breath sounds: Normal breath sounds. No stridor. No wheezing, rhonchi or rales.  Chest:     Chest wall: No tenderness.  Abdominal:     General: Abdomen is flat. Bowel sounds are normal. There is no distension.     Palpations: Abdomen is soft. There is no mass.     Tenderness: There is no abdominal tenderness. There is no right CVA tenderness, left CVA tenderness, guarding or rebound.     Hernia: No hernia is present.  Musculoskeletal:        General: No swelling, tenderness, deformity or signs of injury. Normal range of motion.     Cervical back: Normal range of motion and neck supple.     Right lower leg: No edema.     Left lower leg: No edema.  Lymphadenopathy:     Cervical: No cervical adenopathy.  Skin:    General: Skin is warm and dry.     Coloration: Skin is not jaundiced or pale.     Findings: No bruising, erythema, lesion or rash.  Neurological:     General: No focal deficit present.     Mental Status: She is alert and oriented to person, place, and time.      Cranial Nerves: No cranial nerve deficit.     Sensory: No sensory deficit.     Motor: No weakness.     Coordination: Coordination normal.     Gait: Gait normal.     Deep Tendon Reflexes: Reflexes normal.  Psychiatric:        Mood and Affect: Mood normal.        Behavior: Behavior normal.        Thought Content: Thought content normal.        Judgment: Judgment normal.       Assessment & Plan:

## 2020-04-01 ENCOUNTER — Ambulatory Visit: Payer: Medicare Other

## 2020-04-05 DIAGNOSIS — M1712 Unilateral primary osteoarthritis, left knee: Secondary | ICD-10-CM | POA: Diagnosis not present

## 2020-04-12 DIAGNOSIS — M1712 Unilateral primary osteoarthritis, left knee: Secondary | ICD-10-CM | POA: Diagnosis not present

## 2020-04-23 DIAGNOSIS — M1712 Unilateral primary osteoarthritis, left knee: Secondary | ICD-10-CM | POA: Diagnosis not present

## 2020-06-06 DIAGNOSIS — M79671 Pain in right foot: Secondary | ICD-10-CM | POA: Diagnosis not present

## 2020-06-18 ENCOUNTER — Other Ambulatory Visit: Payer: Self-pay | Admitting: Adult Health

## 2020-06-18 DIAGNOSIS — G43801 Other migraine, not intractable, with status migrainosus: Secondary | ICD-10-CM

## 2020-06-19 NOTE — Telephone Encounter (Signed)
SENT TO [facility] ON 08/29/2019 FOR 1 YEAR.  REQUEST IS TOO EARLY.

## 2020-07-04 ENCOUNTER — Other Ambulatory Visit: Payer: Self-pay | Admitting: Adult Health

## 2020-07-04 DIAGNOSIS — M79671 Pain in right foot: Secondary | ICD-10-CM | POA: Diagnosis not present

## 2020-07-04 DIAGNOSIS — G43801 Other migraine, not intractable, with status migrainosus: Secondary | ICD-10-CM

## 2020-07-05 NOTE — Telephone Encounter (Signed)
THIS WAS SENT IN FOR 1 YEAR ON 08/29/2019.  REQUEST IS TOO EARLY.

## 2020-08-05 ENCOUNTER — Ambulatory Visit: Payer: Medicare Other

## 2020-08-13 ENCOUNTER — Other Ambulatory Visit: Payer: Self-pay | Admitting: Adult Health

## 2020-08-14 ENCOUNTER — Other Ambulatory Visit: Payer: Self-pay | Admitting: Adult Health

## 2020-08-15 NOTE — Telephone Encounter (Signed)
Patient need to schedule an ov for more refills. 

## 2020-08-19 ENCOUNTER — Ambulatory Visit (INDEPENDENT_AMBULATORY_CARE_PROVIDER_SITE_OTHER): Payer: Medicare Other

## 2020-08-19 ENCOUNTER — Other Ambulatory Visit: Payer: Self-pay

## 2020-08-19 DIAGNOSIS — Z1231 Encounter for screening mammogram for malignant neoplasm of breast: Secondary | ICD-10-CM | POA: Diagnosis not present

## 2020-08-19 DIAGNOSIS — Z780 Asymptomatic menopausal state: Secondary | ICD-10-CM | POA: Diagnosis not present

## 2020-08-19 DIAGNOSIS — Z0000 Encounter for general adult medical examination without abnormal findings: Secondary | ICD-10-CM

## 2020-08-19 DIAGNOSIS — Z1211 Encounter for screening for malignant neoplasm of colon: Secondary | ICD-10-CM | POA: Diagnosis not present

## 2020-08-19 NOTE — Progress Notes (Signed)
Subjective:   Lisa Morris is a 69 y.o. female who presents for Medicare Annual (Subsequent) preventive examination.  I connected with Haynes Hoehn today by telephone and verified that I am speaking with the correct person using two identifiers. Location patient: home Location provider: work Persons participating in the virtual visit: patient, provider.   I discussed the limitations, risks, security and privacy concerns of performing an evaluation and management service by telephone and the availability of in person appointments. I also discussed with the patient that there may be a patient responsible charge related to this service. The patient expressed understanding and verbally consented to this telephonic visit.    Interactive audio and video telecommunications were attempted between this provider and patient, however failed, due to patient having technical difficulties OR patient did not have access to video capability.  We continued and completed visit with audio only.     Review of Systems    N/a Cardiac Risk Factors include: advanced age (>49men, >28 women);dyslipidemia     Objective:    Today's Vitals   There is no height or weight on file to calculate BMI.  Advanced Directives 08/19/2020 12/12/2012  Does Patient Have a Medical Advance Directive? No -  Would patient like information on creating a medical advance directive? No - Patient declined -  Pre-existing out of facility DNR order (yellow form or pink MOST form) - No    Current Medications (verified) Outpatient Encounter Medications as of 08/19/2020  Medication Sig  . atorvastatin (LIPITOR) 10 MG tablet TAKE 1 TABLET BY MOUTH  DAILY  . Calcium 500 MG tablet Take 600 mg by mouth.  . cyclobenzaprine (FLEXERIL) 10 MG tablet TAKE 2 TABLETS BY MOUTH IN  THE EVENING  . melatonin 5 MG TABS Take 5 mg by mouth.  . promethazine (PHENERGAN) 25 MG tablet Take 1 tablet (25 mg total) by mouth every 6 (six) hours as  needed for nausea.  . rizatriptan (MAXALT) 10 MG tablet Take 1 tablet (10 mg total) by mouth as needed. May repeat in 2 hours if needed  . topiramate (TOPAMAX) 100 MG tablet Take 1 tablet (100 mg total) by mouth every evening.  . venlafaxine XR (EFFEXOR-XR) 75 MG 24 hr capsule TAKE 3 CAPSULES BY MOUTH AT BEDTIME  . fluticasone (FLONASE) 50 MCG/ACT nasal spray Place into both nostrils daily. (Patient not taking: Reported on 08/19/2020)   No facility-administered encounter medications on file as of 08/19/2020.    Allergies (verified) Patient has no known allergies.   History: Past Medical History:  Diagnosis Date  . Arthritis   . Chicken pox   . Complication of anesthesia   . Headache(784.0)    Takes Maxlt  . Primary osteoarthritis of right shoulder 12/19/2012  . Seasonal allergies    Past Surgical History:  Procedure Laterality Date  . [facility]    . LIPOSUCTION     Neck  . TOTAL SHOULDER ARTHROPLASTY  12/19/2012   Procedure: TOTAL SHOULDER ARTHROPLASTY;  Surgeon: [name], MD;  Location: [place];  Service: Orthopedics;  Laterality: Right;   Family History  Problem Relation Age of Onset  . Arthritis Mother   . Hearing loss Mother   . Hypertension Mother   . Hearing loss Father   . Hyperlipidemia Father   . Hypertension Father   . Stroke Father   . Heart disease Maternal Grandmother   . Rheumatic fever Maternal Grandmother    Social History   Socioeconomic History  . Marital  status: Married    Spouse name: Not on file  . Number of children: Not on file  . Years of education: Not on file  . Highest education level: Not on file  Occupational History  . Not on file  Tobacco Use  . Smoking status: Never Smoker  . Smokeless tobacco: Never Used  Vaping Use  . Vaping Use: Never used  Substance and Sexual Activity  . Alcohol use: Yes    Alcohol/week: 8.0 standard drinks    Types: 8 Glasses of wine per week  . Drug use: No  . Sexual activity: Not on file    Other Topics Concern  . Not on file  Social History Narrative   Married    Social Determinants of Health   Financial Resource Strain: Low Risk   . Difficulty of Paying Living Expenses: Not hard at all  Food Insecurity: No Food Insecurity  . Worried About Charity fundraiser in the Last Year: Never true  . Ran Out of Food in the Last Year: Never true  Transportation Needs: No Transportation Needs  . Lack of Transportation (Medical): No  . Lack of Transportation (Non-Medical): No  Physical Activity: Inactive  . Days of Exercise per Week: 0 days  . Minutes of Exercise per Session: 0 min  Stress: No Stress Concern Present  . Feeling of Stress : Not at all  Social Connections: Socially Integrated  . Frequency of Communication with Friends and Family: More than three times a week  . Frequency of Social Gatherings with Friends and Family: Three times a week  . Attends Religious Services: 1 to 4 times per year  . Active Member of Clubs or Organizations: Yes  . Attends Archivist Meetings: Not on file  . Marital Status: Married    Tobacco Counseling Counseling given: Not Answered   Clinical Intake:  Pre-visit preparation completed: Yes  Pain : No/denies pain     Nutritional Risks: None Diabetes: No  How often do you need to have someone help you when you read instructions, pamphlets, or other written materials from your doctor or pharmacy?: 1 - Never What is the last grade level you completed in school?: 4 years of college  Diabetic?No  Interpreter Needed?: No  Information entered by :: [employer] [place] of Daily Living In your present state of health, do you have any difficulty performing the following activities: 08/19/2020  Hearing? Y  Comment Wears a hearing aid  Vision? N  Difficulty concentrating or making decisions? N  Walking or climbing stairs? N  Dressing or bathing? N  Doing errands, shopping? N  Preparing Food and eating ? N  Using  the Toilet? N  In the past six months, have you accidently leaked urine? Y  Comment Has occassional bladder leakage  Do you have problems with loss of bowel control? N  Managing your Medications? N  Managing your Finances? N  Housekeeping or managing your Housekeeping? N  Some recent data might be hidden    Patient Care Team: Dorothyann Peng, NP as PCP - General (Family Medicine) Susa Day, MD as Consulting Physician (Orthopedic Surgery)  Indicate any recent Medical Services you may have received from other than [facility] providers in the past year (date may be approximate).     Assessment:   This is a routine wellness examination for [place].  Hearing/Vision screen  Hearing Screening   125Hz  250Hz  500Hz  1000Hz  2000Hz  3000Hz  4000Hz  6000Hz  8000Hz   Right ear:  Left ear:           Vision Screening Comments: Patient states does not get eye checked yearly. Gets eyes checked every other year  Dietary issues and exercise activities discussed: Current Exercise Habits: The patient does not participate in regular exercise at present  Goals    . Patient Stated     I want to get knee in better shape so that I may get back to exercising       Depression Screen PHQ 2/9 Scores 08/19/2020 04/14/2018  PHQ - 2 Score 0 0  PHQ- 9 Score 0 -    Fall Risk Fall Risk  08/19/2020 04/14/2018  Falls in the past year? 0 No  Number falls in past yr: 0 -  Injury with Fall? 0 -  Risk for fall due to : No Fall Risks -  Follow up Falls evaluation completed;Falls prevention discussed -    Any stairs in or around the home? Yes  If so, are there any without handrails? No  Home free of loose throw rugs in walkways, pet beds, electrical cords, etc? Yes  Adequate lighting in your home to reduce risk of falls? Yes   ASSISTIVE DEVICES UTILIZED TO PREVENT FALLS:  Life alert? No  Use of a cane, walker or w/c? No  Grab bars in the bathroom? No  Shower chair or bench in shower? No  Elevated toilet  seat or a handicapped toilet? No   Cognitive Function:  Cognitive screening not indicated based on direct observation      Immunizations Immunization History  Administered Date(s) Administered  . Fluad Quad(high Dose 65+) 08/29/2019  . PFIZER SARS-COV-2 Vaccination 02/19/2020, 03/11/2020  . Pneumococcal Conjugate-13 02/11/2018  . Pneumococcal-Unspecified 03/28/2018  . Tdap 07/27/2014    TDAP status: Up to date Flu Vaccine status: Up to date Pneumococcal vaccine status: Up to date Covid-19 vaccine status: Completed vaccines  Qualifies for Shingles Vaccine? Yes   Zostavax completed No   Shingrix Completed?: No.    Education has been provided regarding the importance of this vaccine. Patient has been advised to call insurance company to determine out of pocket expense if they have not yet received this vaccine. Advised may also receive vaccine at local pharmacy or Health Dept. Verbalized acceptance and understanding.  Screening Tests Health Maintenance  Topic Date Due  . MAMMOGRAM  Never done  . COLONOSCOPY  Never done  . DEXA SCAN  Never done  . INFLUENZA VACCINE  07/28/2020  . TETANUS/TDAP  07/27/2024  . COVID-19 Vaccine  Completed  . Hepatitis C Screening  Completed  . PNA vac Low Risk Adult  Completed    Health Maintenance  Health Maintenance Due  Topic Date Due  . MAMMOGRAM  Never done  . COLONOSCOPY  Never done  . DEXA SCAN  Never done  . INFLUENZA VACCINE  07/28/2020    Colorectal cancer screening: Referral to GI placed 08/19/2020. Pt aware the office will call re: appt. Mammogram status: Ordered 08/19/2020. Pt provided with contact info and advised to call to schedule appt.  Bone Density status: Ordered 08/19/2020. Pt provided with contact info and advised to call to schedule appt.  Lung Cancer Screening: (Low Dose CT Chest recommended if Age 35-80 years, 30 pack-year currently smoking OR have quit w/in 15years.) does not qualify.   Lung Cancer Screening  Referral: N/A  Additional Screening:  Hepatitis C Screening: does qualify; Completed 04/14/2018  Vision Screening: Recommended annual ophthalmology exams for early detection of glaucoma and  other disorders of the eye. Is the patient up to date with their annual eye exam?  No  Who is the provider or what is the name of the office in which the patient attends annual eye exams? Dr. Webb Laws  If pt is not established with a provider, would they like to be referred to a provider to establish care? No .   Dental Screening: Recommended annual dental exams for proper oral hygiene  Community Resource Referral / Chronic Care Management: CRR required this visit?  No   CCM required this visit?  No      Plan:     I have personally reviewed and noted the following in the patient's chart:   . Medical and social history . Use of alcohol, tobacco or illicit drugs  . Current medications and supplements . Functional ability and status . Nutritional status . Physical activity . Advanced directives . List of other physicians . Hospitalizations, surgeries, and ER visits in previous 12 months . Vitals . Screenings to include cognitive, depression, and falls . Referrals and appointments  In addition, I have reviewed and discussed with patient certain preventive protocols, quality metrics, and best practice recommendations. A written personalized care plan for preventive services as well as general preventive health recommendations were provided to patient.     Ofilia Neas, LPN   04/09/8207   Nurse Notes: None

## 2020-08-19 NOTE — Patient Instructions (Signed)
Lisa Morris , Thank you for taking time to come for your Medicare Wellness Visit. I appreciate your ongoing commitment to your health goals. Please review the following plan we discussed and let me know if I can assist you in the future.   Screening recommendations/referrals: Colonoscopy: Currently due, referral order placed this visit Mammogram: currently due, orders placed this visit Bone Density: currently due, orders placed this visit Recommended yearly ophthalmology/optometry visit for glaucoma screening and checkup Recommended yearly dental visit for hygiene and checkup  Vaccinations: Influenza vaccine: Up to date, next due this flu season 2021 Pneumococcal vaccine: completed series Tdap vaccine: Up to date, next due 07/27/2024 Shingles vaccine: Currently due, please contact your pharmacy to discuss cost and to receive    Advanced directives: Advance directive discussed with you today. Even though you declined this today please call our office should you change your mind and we can give you the proper paperwork for you to fill out.   Conditions/risks identified: None   Next appointment: None    Preventive Care 65 Years and Older, Female Preventive care refers to lifestyle choices and visits with your health care provider that can promote health and wellness. What does preventive care include?  A yearly physical exam. This is also called an annual well check.  Dental exams once or twice a year.  Routine eye exams. Ask your health care provider how often you should have your eyes checked.  Personal lifestyle choices, including:  Daily care of your teeth and gums.  Regular physical activity.  Eating a healthy diet.  Avoiding tobacco and drug use.  Limiting alcohol use.  Practicing safe sex.  Taking low-dose aspirin every day.  Taking vitamin and mineral supplements as recommended by your health care provider. What happens during an annual well check? The services  and screenings done by your health care provider during your annual well check will depend on your age, overall health, lifestyle risk factors, and family history of disease. Counseling  Your health care provider may ask you questions about your:  Alcohol use.  Tobacco use.  Drug use.  Emotional well-being.  Home and relationship well-being.  Sexual activity.  Eating habits.  History of falls.  Memory and ability to understand (cognition).  Work and work Statistician.  Reproductive health. Screening  You may have the following tests or measurements:  Height, weight, and BMI.  Blood pressure.  Lipid and cholesterol levels. These may be checked every 5 years, or more frequently if you are over 33 years old.  Skin check.  Lung cancer screening. You may have this screening every year starting at age 27 if you have a 30-pack-year history of smoking and currently smoke or have quit within the past 15 years.  Fecal occult blood test (FOBT) of the stool. You may have this test every year starting at age 60.  Flexible sigmoidoscopy or colonoscopy. You may have a sigmoidoscopy every 5 years or a colonoscopy every 10 years starting at age 75.  Hepatitis C blood test.  Hepatitis B blood test.  Sexually transmitted disease (STD) testing.  Diabetes screening. This is done by checking your blood sugar (glucose) after you have not eaten for a while (fasting). You may have this done every 1-3 years.  Bone density scan. This is done to screen for osteoporosis. You may have this done starting at age 68.  Mammogram. This may be done every 1-2 years. Talk to your health care provider about how often you should have  regular mammograms. Talk with your health care provider about your test results, treatment options, and if necessary, the need for more tests. Vaccines  Your health care provider may recommend certain vaccines, such as:  Influenza vaccine. This is recommended every  year.  Tetanus, diphtheria, and acellular pertussis (Tdap, Td) vaccine. You may need a Td booster every 10 years.  Zoster vaccine. You may need this after age 35.  Pneumococcal 13-valent conjugate (PCV13) vaccine. One dose is recommended after age 26.  Pneumococcal polysaccharide (PPSV23) vaccine. One dose is recommended after age 76. Talk to your health care provider about which screenings and vaccines you need and how often you need them. This information is not intended to replace advice given to you by your health care provider. Make sure you discuss any questions you have with your health care provider. Document Released: 01/10/2016 Document Revised: 09/02/2016 Document Reviewed: 10/15/2015 Elsevier Interactive Patient Education  2017 [place] Prevention in the Home Falls can cause injuries. They can happen to people of all ages. There are many things you can do to make your home safe and to help prevent falls. What can I do on the outside of my home?  Regularly fix the edges of walkways and driveways and fix any cracks.  Remove anything that might make you trip as you walk through a door, such as a raised step or threshold.  Trim any bushes or trees on the path to your home.  Use bright outdoor lighting.  Clear any walking paths of anything that might make someone trip, such as rocks or tools.  Regularly check to see if handrails are loose or broken. Make sure that both sides of any steps have handrails.  Any raised decks and porches should have guardrails on the edges.  Have any leaves, snow, or ice cleared regularly.  Use sand or salt on walking paths during winter.  Clean up any spills in your garage right away. This includes oil or grease spills. What can I do in the bathroom?  Use night lights.  Install grab bars by the toilet and in the tub and shower. Do not use towel bars as grab bars.  Use non-skid mats or decals in the tub or shower.  If you  need to sit down in the shower, use a plastic, non-slip stool.  Keep the floor dry. Clean up any water that spills on the floor as soon as it happens.  Remove soap buildup in the tub or shower regularly.  Attach bath mats securely with double-sided non-slip rug tape.  Do not have throw rugs and other things on the floor that can make you trip. What can I do in the bedroom?  Use night lights.  Make sure that you have a light by your bed that is easy to reach.  Do not use any sheets or blankets that are too big for your bed. They should not hang down onto the floor.  Have a firm chair that has side arms. You can use this for support while you get dressed.  Do not have throw rugs and other things on the floor that can make you trip. What can I do in the kitchen?  Clean up any spills right away.  Avoid walking on wet floors.  Keep items that you use a lot in easy-to-reach places.  If you need to reach something above you, use a strong step stool that has a grab bar.  Keep electrical cords out of the  way.  Do not use floor polish or wax that makes floors slippery. If you must use wax, use non-skid floor wax.  Do not have throw rugs and other things on the floor that can make you trip. What can I do with my stairs?  Do not leave any items on the stairs.  Make sure that there are handrails on both sides of the stairs and use them. Fix handrails that are broken or loose. Make sure that handrails are as long as the stairways.  Check any carpeting to make sure that it is firmly attached to the stairs. Fix any carpet that is loose or worn.  Avoid having throw rugs at the top or bottom of the stairs. If you do have throw rugs, attach them to the floor with carpet tape.  Make sure that you have a light switch at the top of the stairs and the bottom of the stairs. If you do not have them, ask someone to add them for you. What else can I do to help prevent falls?  Wear shoes  that:  Do not have high heels.  Have rubber bottoms.  Are comfortable and fit you well.  Are closed at the toe. Do not wear sandals.  If you use a stepladder:  Make sure that it is fully opened. Do not climb a closed stepladder.  Make sure that both sides of the stepladder are locked into place.  Ask someone to hold it for you, if possible.  Clearly mark and make sure that you can see:  Any grab bars or handrails.  First and last steps.  Where the edge of each step is.  Use tools that help you move around (mobility aids) if they are needed. These include:  Canes.  Walkers.  Scooters.  Crutches.  Turn on the lights when you go into a dark area. Replace any light bulbs as soon as they burn out.  Set up your furniture so you have a clear path. Avoid moving your furniture around.  If any of your floors are uneven, fix them.  If there are any pets around you, be aware of where they are.  Review your medicines with your doctor. Some medicines can make you feel dizzy. This can increase your chance of falling. Ask your doctor what other things that you can do to help prevent falls. This information is not intended to replace advice given to you by your health care provider. Make sure you discuss any questions you have with your health care provider. Document Released: 10/10/2009 Document Revised: 05/21/2016 Document Reviewed: 01/18/2015 Elsevier Interactive Patient Education  2017 [employer].

## 2020-08-23 ENCOUNTER — Other Ambulatory Visit: Payer: Self-pay | Admitting: Family Medicine

## 2020-08-24 ENCOUNTER — Other Ambulatory Visit: Payer: Self-pay | Admitting: Adult Health

## 2020-08-29 ENCOUNTER — Other Ambulatory Visit: Payer: Self-pay | Admitting: Adult Health

## 2020-08-30 ENCOUNTER — Other Ambulatory Visit: Payer: Self-pay | Admitting: Adult Health

## 2020-08-30 DIAGNOSIS — G43801 Other migraine, not intractable, with status migrainosus: Secondary | ICD-10-CM

## 2020-08-30 MED ORDER — TOPIRAMATE 100 MG PO TABS: 100.0000 mg | ORAL_TABLET | Freq: Every evening | ORAL | 0 refills | Status: DC

## 2020-08-30 NOTE — Telephone Encounter (Signed)
Rx was sent into [facility] this morning. Rx for 10 tabs sent to [facility].

## 2020-08-30 NOTE — Telephone Encounter (Signed)
Pt stated she has no Topamax left and ask that we still refill it through [facility] but if 10 pills can be sent to [facility] until they are able to deliver it b.c it will be a week or so before it can get to her .   [id number] [address], [address] AT [address] Phone:  [phone]  Fax:  [phone]

## 2020-09-09 ENCOUNTER — Encounter: Payer: Self-pay | Admitting: Adult Health

## 2020-09-10 ENCOUNTER — Other Ambulatory Visit: Payer: Medicare Other

## 2020-09-10 DIAGNOSIS — J069 Acute upper respiratory infection, unspecified: Secondary | ICD-10-CM | POA: Diagnosis not present

## 2020-09-10 DIAGNOSIS — Z20822 Contact with and (suspected) exposure to covid-19: Secondary | ICD-10-CM | POA: Diagnosis not present

## 2020-09-15 DIAGNOSIS — U071 COVID-19: Secondary | ICD-10-CM | POA: Diagnosis not present

## 2020-09-23 ENCOUNTER — Encounter: Payer: Self-pay | Admitting: Gastroenterology

## 2020-10-07 ENCOUNTER — Other Ambulatory Visit: Payer: Self-pay

## 2020-10-07 MED ORDER — RIZATRIPTAN BENZOATE 10 MG PO TABS: 10.0000 mg | ORAL_TABLET | ORAL | 0 refills | Status: AC | PRN

## 2020-10-24 ENCOUNTER — Other Ambulatory Visit: Payer: Self-pay | Admitting: Adult Health

## 2020-11-04 ENCOUNTER — Other Ambulatory Visit: Payer: Self-pay

## 2020-11-04 ENCOUNTER — Ambulatory Visit (AMBULATORY_SURGERY_CENTER): Payer: Self-pay

## 2020-11-04 ENCOUNTER — Telehealth: Payer: Self-pay

## 2020-11-04 VITALS — Ht 62.0 in | Wt 185.0 lb

## 2020-11-04 DIAGNOSIS — Z1211 Encounter for screening for malignant neoplasm of colon: Secondary | ICD-10-CM

## 2020-11-04 NOTE — Telephone Encounter (Signed)
Pt here for previsit today, pt was unhappy as she stated she "went all over this place" trying to check in and was rude to April and tried to be argumentative to me, however, I was able to diffuse her a bit.  As we were halfway through the appointment, she realized she wanted to change her physician to Dr Silverio Decamp.  I let her know the procedure and that we will have to check with both doctors and her colonoscopy may not be able to be done until January.  Pt was ok with all of that and wishes to change providers, please advise.

## 2020-11-04 NOTE — Telephone Encounter (Signed)
Call to pt to schedule pv and colonoscopy with Dr Silverio Decamp, no answer, lm for pt to call and sched virtual pv and colonoscopy in AM or to call pv tomorrow if any questions.

## 2020-11-04 NOTE — Progress Notes (Signed)
No egg or soy allergy known to patient  No issues with past sedation with any surgeries or procedures no intubation problems in the past  No FH of Malignant Hyperthermia No diet pills per patient No home 02 use per patient  No blood thinners per patient  Pt denies issues with constipation  No A fib or A flutter  EMMI video to pt or via MyChart  COVID 19 guidelines implemented in PV today with Pt and RN   Previsit ended halfway through the visit: As we were halfway through the appointment, she realized she wanted to change her physician to Dr Silverio Decamp.  I let her know the procedure and that we will have to check with both doctors and her colonoscopy may not be able to be done until January.  Pt was ok with all of that and wishes to change providers.      Due to the COVID-19 pandemic we are asking patients to follow these guidelines. Please only bring one care partner. Please be aware that your care partner may wait in the car in the parking lot or if they feel like they will be too hot to wait in the car, they may wait in the lobby on the 4th floor. All care partners are required to wear a mask the entire time (we do not have any that we can provide them), they need to practice social distancing, and we will do a Covid check for all patient's and care partners when you arrive. Also we will check their temperature and your temperature. If the care partner waits in their car they need to stay in the parking lot the entire time and we will call them on their cell phone when the patient is ready for discharge so they can bring the car to the front of the building. Also all patient's will need to wear a mask into building.

## 2020-11-04 NOTE — Telephone Encounter (Signed)
Ok to schedule for next available appointment for colonoscopy. Thanks

## 2020-11-04 NOTE — Telephone Encounter (Signed)
OK with me per patient preference. 

## 2020-11-18 ENCOUNTER — Encounter: Payer: Medicare Other | Admitting: Gastroenterology

## 2020-11-19 ENCOUNTER — Other Ambulatory Visit: Payer: Self-pay | Admitting: Adult Health

## 2021-01-06 DIAGNOSIS — H5203 Hypermetropia, bilateral: Secondary | ICD-10-CM | POA: Diagnosis not present

## 2021-01-09 ENCOUNTER — Ambulatory Visit (AMBULATORY_SURGERY_CENTER): Payer: Medicare Other

## 2021-01-09 VITALS — Ht 62.0 in | Wt 180.0 lb

## 2021-01-09 DIAGNOSIS — Z1211 Encounter for screening for malignant neoplasm of colon: Secondary | ICD-10-CM

## 2021-01-09 MED ORDER — NA SULFATE-K SULFATE-MG SULF 17.5-3.13-1.6 GM/177ML PO SOLN: 1.0000 | Freq: Once | ORAL | 0 refills | Status: AC

## 2021-01-09 NOTE — Progress Notes (Signed)
No egg or soy allergy known to patient  No issues with past sedation with any surgeries or procedures No intubation problems in the past  No FH of Malignant Hyperthermia No diet pills per patient No home 02 use per patient  No blood thinners per patient  Pt denies issues with constipation  No A fib or A flutter  EMMI video to pt or via MyChart  COVID 19 guidelines implemented in PV today with Pt and RN   Pt is fully vaccinated  for Covid and booster  VIRTUAL PRE-VISIT  Due to the COVID-19 pandemic we are asking patients to follow certain guidelines.  Pt aware of COVID protocols and [facility] guidelines   

## 2021-01-13 ENCOUNTER — Encounter: Payer: Self-pay | Admitting: Gastroenterology

## 2021-01-17 ENCOUNTER — Encounter: Payer: Self-pay | Admitting: Gastroenterology

## 2021-01-17 ENCOUNTER — Ambulatory Visit (AMBULATORY_SURGERY_CENTER): Payer: Medicare Other | Admitting: Gastroenterology

## 2021-01-17 ENCOUNTER — Other Ambulatory Visit: Payer: Self-pay

## 2021-01-17 VITALS — BP 102/57 | HR 80 | Temp 98.7°F | Resp 12 | Ht 62.0 in | Wt 180.0 lb

## 2021-01-17 DIAGNOSIS — K589 Irritable bowel syndrome without diarrhea: Secondary | ICD-10-CM

## 2021-01-17 DIAGNOSIS — Z1211 Encounter for screening for malignant neoplasm of colon: Secondary | ICD-10-CM | POA: Diagnosis not present

## 2021-01-17 MED ORDER — DICYCLOMINE HCL 10 MG PO CAPS: 10.0000 mg | ORAL_CAPSULE | Freq: Three times a day (TID) | ORAL | 0 refills | Status: DC

## 2021-01-17 MED ORDER — SODIUM CHLORIDE 0.9 % IV SOLN: 500.0000 mL | Freq: Once | INTRAVENOUS | Status: DC

## 2021-01-17 NOTE — Patient Instructions (Addendum)
HANDOUTS PROVIDED ON: Diverticulosis  Repeat Colonoscopy in 10 years for screening.  Return to GI clinic in 3 months for IBS/abdominal cramping follow up, call office to schedule appointment.  Use Bentyl three times a day before meals.   You may resume your previous diet and medication schedule.  Thank you for allowing [place] to care for you today!!!   YOU HAD AN ENDOSCOPIC PROCEDURE TODAY AT [facility]:   Refer to the procedure report that was given to you for any specific questions about what was found during the examination.  If the procedure report does not answer your questions, please call your gastroenterologist to clarify.  If you requested that your care partner not be given the details of your procedure findings, then the procedure report has been included in a sealed envelope for you to review at your convenience later.  YOU SHOULD EXPECT: Some feelings of bloating in the abdomen. Passage of more gas than usual.  Walking can help get rid of the air that was put into your GI tract during the procedure and reduce the bloating. If you had a lower endoscopy (such as a colonoscopy or flexible sigmoidoscopy) you may notice spotting of blood in your stool or on the toilet paper. If you underwent a bowel prep for your procedure, you may not have a normal bowel movement for a few days.  Please Note:  You might notice some irritation and congestion in your nose or some drainage.  This is from the oxygen used during your procedure.  There is no need for concern and it should clear up in a day or so.  SYMPTOMS TO REPORT IMMEDIATELY:   Following lower endoscopy (colonoscopy or flexible sigmoidoscopy):  Excessive amounts of blood in the stool  Significant tenderness or worsening of abdominal pains  Swelling of the abdomen that is new, acute  Fever of 100F or higher  For urgent or emergent issues, a gastroenterologist can be reached at any hour by calling [phone]. Do not  use MyChart messaging for urgent concerns.    DIET:  We do recommend a small meal at first, but then you may proceed to your regular diet.  Drink plenty of fluids but you should avoid alcoholic beverages for 24 hours.  ACTIVITY:  You should plan to take it easy for the rest of today and you should NOT DRIVE or use heavy machinery until tomorrow (because of the sedation medicines used during the test).    FOLLOW UP: Our staff will call the number listed on your records 48-72 hours following your procedure to check on you and address any questions or concerns that you may have regarding the information given to you following your procedure. If we do not reach you, we will leave a message.  We will attempt to reach you two times.  During this call, we will ask if you have developed any symptoms of COVID 19. If you develop any symptoms (ie: fever, flu-like symptoms, shortness of breath, cough etc.) before then, please call [phone].  If you test positive for Covid 19 in the 2 weeks post procedure, please call and report this information to [place].    If any biopsies were taken you will be contacted by phone or by letter within the next 1-3 weeks.  Please call us at [phone] if you have not heard about the biopsies in 3 weeks.    SIGNATURES/CONFIDENTIALITY: You and/or your care partner have signed paperwork which will be entered  into your electronic medical record.  These signatures attest to the fact that that the information above on your After Visit Summary has been reviewed and is understood.  Full responsibility of the confidentiality of this discharge information lies with you and/or your care-partner.

## 2021-01-17 NOTE — Progress Notes (Signed)
Report given to PACU, vss 

## 2021-01-17 NOTE — Op Note (Signed)
[facility] Patient Name: Lisa Morris Procedure Date: 01/17/2021 2:47 PM MRN: [id number] Endoscopist: Mauri Pole , MD Age: 70 Referring MD:  Date of Birth: November 17, 1951 Gender: Female Account #: [id number] Procedure:                Colonoscopy Indications:              Screening for colorectal malignant neoplasm Medicines:                Monitored Anesthesia Care Procedure:                Pre-Anesthesia Assessment:                           - Prior to the procedure, a History and Physical                            was performed, and patient medications and                            allergies were reviewed. The patient's tolerance of                            previous anesthesia was also reviewed. The risks                            and benefits of the procedure and the sedation                            options and risks were discussed with the patient.                            All questions were answered, and informed consent                            was obtained. Prior Anticoagulants: The patient has                            taken no previous anticoagulant or antiplatelet                            agents. ASA Grade Assessment: II - A patient with                            mild systemic disease. After reviewing the risks                            and benefits, the patient was deemed in                            satisfactory condition to undergo the procedure.                           After obtaining informed consent, the colonoscope  was passed under direct vision. Throughout the                            procedure, the patient's blood pressure, pulse, and                            oxygen saturations were monitored continuously. The                            Olympus [id number]) Colonoscope was                            introduced through the anus and advanced to the the                            cecum,  identified by appendiceal orifice and                            ileocecal valve. The colonoscopy was performed                            without difficulty. The patient tolerated the                            procedure well. The quality of the bowel                            preparation was good. The ileocecal valve,                            appendiceal orifice, and rectum were photographed. Scope In: 3:03:20 PM Scope Out: 3:20:52 PM Scope Withdrawal Time: 0 hours 9 minutes 3 seconds  Total Procedure Duration: 0 hours 17 minutes 32 seconds  Findings:                 The perianal and digital rectal examinations were                            normal.                           Non-bleeding internal hemorrhoids were found during                            retroflexion. The hemorrhoids were small.                           The exam was otherwise without abnormality. Complications:            No immediate complications. Estimated Blood Loss:     Estimated blood loss was minimal. Impression:               - Non-bleeding internal hemorrhoids.                           - The examination was otherwise normal.                           -  No specimens collected. Recommendation:           - Patient has a contact number available for                            emergencies. The signs and symptoms of potential                            delayed complications were discussed with the                            patient. Return to normal activities tomorrow.                            Written discharge instructions were provided to the                            patient.                           - Resume previous diet.                           - Continue present medications.                           - Repeat colonoscopy in 10 years for screening                            purposes.                           - Return to GI clinic in 3 months for IBS/abdominal                            cramping  follow up, please call to schedule                            appointment.                           - Use Bentyl (dicyclomine) 10 mg PO TID 30 min AC X                            90 tabs. Mauri Pole, MD 01/17/2021 3:26:02 PM This report has been signed electronically.

## 2021-01-17 NOTE — Progress Notes (Signed)
Medical history reviewed with no changes since PV. VS assessed by C.W 

## 2021-01-21 ENCOUNTER — Telehealth: Payer: Self-pay

## 2021-01-21 ENCOUNTER — Telehealth: Payer: Self-pay | Admitting: *Deleted

## 2021-01-21 NOTE — Telephone Encounter (Signed)
  Follow up Call-  Call back number 01/17/2021  Post procedure Call Back phone  # [phone]  Permission to leave phone message Yes  Some recent data might be hidden     1st follow up call made.  NALM

## 2021-01-21 NOTE — Telephone Encounter (Signed)
  Follow up Call-  Call back number 01/17/2021  Post procedure Call Back phone  # [phone]  Permission to leave phone message Yes  Some recent data might be hidden     Patient questions:  Do you have a fever, pain , or abdominal swelling? No. Pain Score  0 *  Have you tolerated food without any problems? Yes.    Have you been able to return to your normal activities? Yes.    Do you have any questions about your discharge instructions: Diet   No. Medications  No. Follow up visit  No.  Do you have questions or concerns about your Care? No.  Actions: * If pain score is 4 or above: No action needed, pain <4.  1. Have you developed a fever since your procedure? no  2.   Have you had an respiratory symptoms (SOB or cough) since your procedure? no  3.   Have you tested positive for COVID 19 since your procedure no  4.   Have you had any family members/close contacts diagnosed with the COVID 19 since your procedure?  no   If yes to any of these questions please route to Joylene John, RN and Joella Prince, RN

## 2021-02-09 ENCOUNTER — Other Ambulatory Visit: Payer: Self-pay | Admitting: Adult Health

## 2021-02-12 ENCOUNTER — Other Ambulatory Visit: Payer: Self-pay | Admitting: Adult Health

## 2021-02-12 NOTE — Telephone Encounter (Signed)
DENIED.  PAST DUE FOR CPX.

## 2021-02-13 ENCOUNTER — Other Ambulatory Visit: Payer: Self-pay | Admitting: Adult Health

## 2021-02-13 NOTE — Telephone Encounter (Signed)
DENIED.  PT IS PAST DUE FOR CPX.

## 2021-07-03 ENCOUNTER — Other Ambulatory Visit: Payer: Self-pay | Admitting: Adult Health

## 2021-07-03 DIAGNOSIS — G43801 Other migraine, not intractable, with status migrainosus: Secondary | ICD-10-CM

## 2021-08-12 ENCOUNTER — Ambulatory Visit (INDEPENDENT_AMBULATORY_CARE_PROVIDER_SITE_OTHER): Payer: Medicare Other

## 2021-08-12 DIAGNOSIS — Z780 Asymptomatic menopausal state: Secondary | ICD-10-CM

## 2021-08-12 DIAGNOSIS — Z0000 Encounter for general adult medical examination without abnormal findings: Secondary | ICD-10-CM

## 2021-08-12 DIAGNOSIS — F419 Anxiety disorder, unspecified: Secondary | ICD-10-CM | POA: Diagnosis not present

## 2021-08-12 DIAGNOSIS — Z1231 Encounter for screening mammogram for malignant neoplasm of breast: Secondary | ICD-10-CM

## 2021-08-12 NOTE — Progress Notes (Signed)
Subjective:   Lisa Morris is a 70 y.o. female who presents for Medicare Annual (Subsequent) preventive examination.  I connected with Haynes Hoehn today by telephone and verified that I am speaking with the correct person using two identifiers. Location patient: home Location provider: work Persons participating in the virtual visit: patient, provider.   I discussed the limitations, risks, security and privacy concerns of performing an evaluation and management service by telephone and the availability of in person appointments. I also discussed with the patient that there may be a patient responsible charge related to this service. The patient expressed understanding and verbally consented to this telephonic visit.    Interactive audio and video telecommunications were attempted between this provider and patient, however failed, due to patient having technical difficulties OR patient did not have access to video capability.  We continued and completed visit with audio only.    Review of Systems    N/a       Objective:    There were no vitals filed for this visit. There is no height or weight on file to calculate BMI.  Advanced Directives 08/19/2020 12/12/2012  Does Patient Have a Medical Advance Directive? No -  Would patient like information on creating a medical advance directive? No - Patient declined -  Pre-existing out of facility DNR order (yellow form or pink MOST form) - No    Current Medications (verified) Outpatient Encounter Medications as of 08/12/2021  Medication Sig   atorvastatin (LIPITOR) 10 MG tablet TAKE 1 TABLET BY MOUTH  DAILY   Calcium 500 MG tablet Take 600 mg by mouth.   cyclobenzaprine (FLEXERIL) 10 MG tablet TAKE 2 TABLETS BY MOUTH IN  THE EVENING   dicyclomine (BENTYL) 10 MG capsule Take 1 capsule (10 mg total) by mouth 4 (four) times daily -  before meals and at bedtime.   fluticasone (FLONASE) 50 MCG/ACT nasal spray Place into both nostrils  daily as needed.   melatonin 5 MG TABS Take 5 mg by mouth.   Naproxen Sodium (ALEVE PO) Take by mouth.   promethazine (PHENERGAN) 25 MG tablet Take 1 tablet (25 mg total) by mouth every 6 (six) hours as needed for nausea. (Patient not taking: Reported on 01/17/2021)   rizatriptan (MAXALT) 10 MG tablet Take 1 tablet (10 mg total) by mouth as needed. May repeat in 2 hours if needed (Patient not taking: Reported on 01/17/2021)   topiramate (TOPAMAX) 100 MG tablet Take 1 tablet (100 mg total) by mouth every evening.   venlafaxine XR (EFFEXOR-XR) 75 MG 24 hr capsule TAKE 3 CAPSULES BY MOUTH AT BEDTIME   No facility-administered encounter medications on file as of 08/12/2021.    Allergies (verified) Patient has no known allergies.   History: Past Medical History:  Diagnosis Date   Allergy    seasonal    Arthritis    Chicken pox    Complication of anesthesia    nausea   Headache(784.0)    Takes Maxlt   Hyperlipidemia    Primary osteoarthritis of right shoulder 12/19/2012   Seasonal allergies    Past Surgical History:  Procedure Laterality Date   [facility]     KNEE ARTHROSCOPY Left 12/2018   around 2019 or 2020   LIPOSUCTION     Neck   TOTAL SHOULDER ARTHROPLASTY  12/19/2012   Procedure: TOTAL SHOULDER ARTHROPLASTY;  Surgeon: [name], MD;  Location: [facility];  Service: Orthopedics;  Laterality: Right;   Family History  Problem  Relation Age of Onset   Arthritis Mother    Hearing loss Mother    Hypertension Mother    Colon polyps Mother    Hearing loss Father    Hyperlipidemia Father    Hypertension Father    Stroke Father    Heart disease Maternal Grandmother    Rheumatic fever Maternal Grandmother    Colon cancer Neg Hx    Esophageal cancer Neg Hx    Rectal cancer Neg Hx    Stomach cancer Neg Hx    Social History   Socioeconomic History   Marital status: Married    Spouse name: Not on file   Number of children: Not on file   Years of education: Not on file    Highest education level: Not on file  Occupational History   Not on file  Tobacco Use   Smoking status: Never   Smokeless tobacco: Never  Vaping Use   Vaping Use: Never used  Substance and Sexual Activity   Alcohol use: Yes    Alcohol/week: 3.0 standard drinks    Types: 3 Glasses of wine per week   Drug use: No   Sexual activity: Not on file  Other Topics Concern   Not on file  Social History Narrative   Married    Social Determinants of Health   Financial Resource Strain: Low Risk    Difficulty of Paying Living Expenses: Not hard at all  Food Insecurity: No Food Insecurity   Worried About Charity fundraiser in the Last Year: Never true   Arboriculturist in the Last Year: Never true  Transportation Needs: No Transportation Needs   Lack of Transportation (Medical): No   Lack of Transportation (Non-Medical): No  Physical Activity: Inactive   Days of Exercise per Week: 0 days   Minutes of Exercise per Session: 0 min  Stress: No Stress Concern Present   Feeling of Stress : Not at all  Social Connections: Socially Integrated   Frequency of Communication with Friends and Family: More than three times a week   Frequency of Social Gatherings with Friends and Family: Three times a week   Attends Religious Services: 1 to 4 times per year   Active Member of Clubs or Organizations: Yes   Attends Archivist Meetings: Not on file   Marital Status: Married    Tobacco Counseling Counseling given: Not Answered   Clinical Intake:                 Diabetic?no         Activities of Daily Living In your present state of health, do you have any difficulty performing the following activities: 08/19/2020  Hearing? Y  Comment Wears a hearing aid  Vision? N  Difficulty concentrating or making decisions? N  Walking or climbing stairs? N  Dressing or bathing? N  Doing errands, shopping? N  Preparing Food and eating ? N  Using the Toilet? N  In the past six  months, have you accidently leaked urine? Y  Comment Has occassional bladder leakage  Do you have problems with loss of bowel control? N  Managing your Medications? N  Managing your Finances? N  Housekeeping or managing your Housekeeping? N  Some recent data might be hidden    Patient Care Team: Dorothyann Peng, NP as PCP - General (Family Medicine) Susa Day, MD as Consulting Physician (Orthopedic Surgery)  Indicate any recent Medical Services you may have received from other than [facility]  providers in the past year (date may be approximate).     Assessment:   This is a routine wellness examination for Brittish.  Hearing/Vision screen No results found.  Dietary issues and exercise activities discussed:     Goals Addressed   None    Depression Screen PHQ 2/9 Scores 08/19/2020 04/14/2018  PHQ - 2 Score 0 0  PHQ- 9 Score 0 -    Fall Risk Fall Risk  08/19/2020 04/14/2018  Falls in the past year? 0 No  Number falls in past yr: 0 -  Injury with Fall? 0 -  Risk for fall due to : No Fall Risks -  Follow up Falls evaluation completed;Falls prevention discussed -    FALL RISK PREVENTION PERTAINING TO THE HOME:  Any stairs in or around the home? Yes  If so, are there any without handrails? No  Home free of loose throw rugs in walkways, pet beds, electrical cords, etc? Yes  Adequate lighting in your home to reduce risk of falls? Yes   ASSISTIVE DEVICES UTILIZED TO PREVENT FALLS:  Life alert? No  Use of a cane, walker or w/c? No  Grab bars in the bathroom? No  Shower chair or bench in shower? No  Elevated toilet seat or a handicapped toilet? No    Cognitive Function:  Normal cognitive status assessed by direct observation by this Nurse Health Advisor. No abnormalities found.        Immunizations Immunization History  Administered Date(s) Administered   Fluad Quad(high Dose 65+) 08/29/2019   PFIZER(Purple Top)SARS-COV-2 Vaccination 02/19/2020, 03/11/2020    Pneumococcal Conjugate-13 02/11/2018   Pneumococcal-Unspecified 03/28/2018   Tdap 07/27/2014    TDAP status: Up to date  Flu Vaccine status: Up to date  Pneumococcal vaccine status: Up to date  Covid-19 vaccine status: Completed vaccines  Qualifies for Shingles Vaccine? Yes   Zostavax completed No   Shingrix Completed?: No.    Education has been provided regarding the importance of this vaccine. Patient has been advised to call insurance company to determine out of pocket expense if they have not yet received this vaccine. Advised may also receive vaccine at local pharmacy or Health Dept. Verbalized acceptance and understanding.  Screening Tests Health Maintenance  Topic Date Due   MAMMOGRAM  Never done   Zoster Vaccines- Shingrix (1 of 2) Never done   DEXA SCAN  Never done   COVID-19 Vaccine (3 - Booster for Pfizer series) 08/11/2020   INFLUENZA VACCINE  07/28/2021   TETANUS/TDAP  07/27/2024   COLONOSCOPY (Pts 45-74yr Insurance coverage will need to be confirmed)  01/17/2031   Hepatitis C Screening  Completed   PNA vac Low Risk Adult  Completed   HPV VACCINES  Aged Out    Health Maintenance  Health Maintenance Due  Topic Date Due   MAMMOGRAM  Never done   Zoster Vaccines- Shingrix (1 of 2) Never done   DEXA SCAN  Never done   COVID-19 Vaccine (3 - Booster for Pfizer series) 08/11/2020   INFLUENZA VACCINE  07/28/2021    Colorectal cancer screening: Type of screening: Colonoscopy. Completed 01/17/2021. Repeat every 10 years  Mammogram status: Ordered 08/12/2021. Pt provided with contact info and advised to call to schedule appt.   Bone Density status: Ordered 08/12/2021. Pt provided with contact info and advised to call to schedule appt.  Lung Cancer Screening: (Low Dose CT Chest recommended if Age 68-80years, 30 pack-year currently smoking OR have quit w/in 15years.) does not qualify.  Lung Cancer Screening Referral: n/a  Additional Screening:  Hepatitis C  Screening: does not qualify; Completed 04/14/2018  Vision Screening: Recommended annual ophthalmology exams for early detection of glaucoma and other disorders of the eye. Is the patient up to date with their annual eye exam?  Yes  Who is the provider or what is the name of the office in which the patient attends annual eye exams? Dr.McFarland  If pt is not established with a provider, would they like to be referred to a provider to establish care? No .   Dental Screening: Recommended annual dental exams for proper oral hygiene  Community Resource Referral / Chronic Care Management: CRR required this visit?  Yes   CCM required this visit?  No      Plan:     I have personally reviewed and noted the following in the patient's chart:   Medical and social history Use of alcohol, tobacco or illicit drugs  Current medications and supplements including opioid prescriptions.  Functional ability and status Nutritional status Physical activity Advanced directives List of other physicians Hospitalizations, surgeries, and ER visits in previous 12 months Vitals Screenings to include cognitive, depression, and falls Referrals and appointments  In addition, I have reviewed and discussed with patient certain preventive protocols, quality metrics, and best practice recommendations. A written personalized care plan for preventive services as well as general preventive health recommendations were provided to patient.     Randel Pigg, LPN   [phone]   Nurse Notes: experiencing anxiety as primary care giver mother , stress

## 2021-08-12 NOTE — Patient Instructions (Addendum)
Lisa Morris , Thank you for taking time to come for your Medicare Wellness Visit. I appreciate your ongoing commitment to your health goals. Please review the following plan we discussed and let me know if I can assist you in the future.   Screening recommendations/referrals: Colonoscopy: 01/17/2021  due 2032 Mammogram: referral 08/12/2021 Bone Density: referral 08/12/2021 Recommended yearly ophthalmology/optometry visit for glaucoma screening and checkup Recommended yearly dental visit for hygiene and checkup  Vaccinations: Influenza vaccine: due in fall 2022  Pneumococcal vaccine: completed series  Tdap vaccine: 07/27/2014 Shingles vaccine: will obtain local pharmacy    Advanced directives: none   Conditions/risks identified:  experiencing anxiety as primary care giver mother , stress   Next appointment: none    Preventive Care 17 Years and Older, Female Preventive care refers to lifestyle choices and visits with your health care provider that can promote health and wellness. What does preventive care include? A yearly physical exam. This is also called an annual well check. Dental exams once or twice a year. Routine eye exams. Ask your health care provider how often you should have your eyes checked. Personal lifestyle choices, including: Daily care of your teeth and gums. Regular physical activity. Eating a healthy diet. Avoiding tobacco and drug use. Limiting alcohol use. Practicing safe sex. Taking low-dose aspirin every day. Taking vitamin and mineral supplements as recommended by your health care provider. What happens during an annual well check? The services and screenings done by your health care provider during your annual well check will depend on your age, overall health, lifestyle risk factors, and family history of disease. Counseling  Your health care provider may ask you questions about your: Alcohol use. Tobacco use. Drug use. Emotional  well-being. Home and relationship well-being. Sexual activity. Eating habits. History of falls. Memory and ability to understand (cognition). Work and work Statistician. Reproductive health. Screening  You may have the following tests or measurements: Height, weight, and BMI. Blood pressure. Lipid and cholesterol levels. These may be checked every 5 years, or more frequently if you are over 53 years old. Skin check. Lung cancer screening. You may have this screening every year starting at age 63 if you have a 30-pack-year history of smoking and currently smoke or have quit within the past 15 years. Fecal occult blood test (FOBT) of the stool. You may have this test every year starting at age 52. Flexible sigmoidoscopy or colonoscopy. You may have a sigmoidoscopy every 5 years or a colonoscopy every 10 years starting at age 10. Hepatitis C blood test. Hepatitis B blood test. Sexually transmitted disease (STD) testing. Diabetes screening. This is done by checking your blood sugar (glucose) after you have not eaten for a while (fasting). You may have this done every 1-3 years. Bone density scan. This is done to screen for osteoporosis. You may have this done starting at age 72. Mammogram. This may be done every 1-2 years. Talk to your health care provider about how often you should have regular mammograms. Talk with your health care provider about your test results, treatment options, and if necessary, the need for more tests. Vaccines  Your health care provider may recommend certain vaccines, such as: Influenza vaccine. This is recommended every year. Tetanus, diphtheria, and acellular pertussis (Tdap, Td) vaccine. You may need a Td booster every 10 years. Zoster vaccine. You may need this after age 72. Pneumococcal 13-valent conjugate (PCV13) vaccine. One dose is recommended after age 34. Pneumococcal polysaccharide (PPSV23) vaccine. One dose is recommended  after age 32. Talk to your  health care provider about which screenings and vaccines you need and how often you need them. This information is not intended to replace advice given to you by your health care provider. Make sure you discuss any questions you have with your health care provider. Document Released: 01/10/2016 Document Revised: 09/02/2016 Document Reviewed: 10/15/2015 Elsevier Interactive Patient Education  2017 [place] Prevention in the Home Falls can cause injuries. They can happen to people of all ages. There are many things you can do to make your home safe and to help prevent falls. What can I do on the outside of my home? Regularly fix the edges of walkways and driveways and fix any cracks. Remove anything that might make you trip as you walk through a door, such as a raised step or threshold. Trim any bushes or trees on the path to your home. Use bright outdoor lighting. Clear any walking paths of anything that might make someone trip, such as rocks or tools. Regularly check to see if handrails are loose or broken. Make sure that both sides of any steps have handrails. Any raised decks and porches should have guardrails on the edges. Have any leaves, snow, or ice cleared regularly. Use sand or salt on walking paths during winter. Clean up any spills in your garage right away. This includes oil or grease spills. What can I do in the bathroom? Use night lights. Install grab bars by the toilet and in the tub and shower. Do not use towel bars as grab bars. Use non-skid mats or decals in the tub or shower. If you need to sit down in the shower, use a plastic, non-slip stool. Keep the floor dry. Clean up any water that spills on the floor as soon as it happens. Remove soap buildup in the tub or shower regularly. Attach bath mats securely with double-sided non-slip rug tape. Do not have throw rugs and other things on the floor that can make you trip. What can I do in the bedroom? Use night  lights. Make sure that you have a light by your bed that is easy to reach. Do not use any sheets or blankets that are too big for your bed. They should not hang down onto the floor. Have a firm chair that has side arms. You can use this for support while you get dressed. Do not have throw rugs and other things on the floor that can make you trip. What can I do in the kitchen? Clean up any spills right away. Avoid walking on wet floors. Keep items that you use a lot in easy-to-reach places. If you need to reach something above you, use a strong step stool that has a grab bar. Keep electrical cords out of the way. Do not use floor polish or wax that makes floors slippery. If you must use wax, use non-skid floor wax. Do not have throw rugs and other things on the floor that can make you trip. What can I do with my stairs? Do not leave any items on the stairs. Make sure that there are handrails on both sides of the stairs and use them. Fix handrails that are broken or loose. Make sure that handrails are as long as the stairways. Check any carpeting to make sure that it is firmly attached to the stairs. Fix any carpet that is loose or worn. Avoid having throw rugs at the top or bottom of the stairs. If you  do have throw rugs, attach them to the floor with carpet tape. Make sure that you have a light switch at the top of the stairs and the bottom of the stairs. If you do not have them, ask someone to add them for you. What else can I do to help prevent falls? Wear shoes that: Do not have high heels. Have rubber bottoms. Are comfortable and fit you well. Are closed at the toe. Do not wear sandals. If you use a stepladder: Make sure that it is fully opened. Do not climb a closed stepladder. Make sure that both sides of the stepladder are locked into place. Ask someone to hold it for you, if possible. Clearly mark and make sure that you can see: Any grab bars or handrails. First and last  steps. Where the edge of each step is. Use tools that help you move around (mobility aids) if they are needed. These include: Canes. Walkers. Scooters. Crutches. Turn on the lights when you go into a dark area. Replace any light bulbs as soon as they burn out. Set up your furniture so you have a clear path. Avoid moving your furniture around. If any of your floors are uneven, fix them. If there are any pets around you, be aware of where they are. Review your medicines with your doctor. Some medicines can make you feel dizzy. This can increase your chance of falling. Ask your doctor what other things that you can do to help prevent falls. This information is not intended to replace advice given to you by your health care provider. Make sure you discuss any questions you have with your health care provider. Document Released: 10/10/2009 Document Revised: 05/21/2016 Document Reviewed: 01/18/2015 Elsevier Interactive Patient Education  2017 [employer].

## 2021-08-15 ENCOUNTER — Telehealth: Payer: Self-pay | Admitting: *Deleted

## 2021-08-15 NOTE — Chronic Care Management (AMB) (Signed)
Please note that the patient has been referred for care management services but has not had a face to face visit with the primary care provider in the last 12 month period. This patient has been referred to the appropriate practice personnel for assistance with scheduling a PCP appointment.   [facility]  Direct Dial: [phone]

## 2021-08-21 ENCOUNTER — Other Ambulatory Visit: Payer: Self-pay | Admitting: Adult Health

## 2021-08-21 DIAGNOSIS — G43801 Other migraine, not intractable, with status migrainosus: Secondary | ICD-10-CM

## 2021-08-22 ENCOUNTER — Encounter: Payer: Self-pay | Admitting: Adult Health

## 2021-08-22 ENCOUNTER — Other Ambulatory Visit: Payer: Self-pay | Admitting: Adult Health

## 2021-08-22 DIAGNOSIS — G43801 Other migraine, not intractable, with status migrainosus: Secondary | ICD-10-CM

## 2021-08-22 MED ORDER — CYCLOBENZAPRINE HCL 10 MG PO TABS: ORAL_TABLET | ORAL | 0 refills | Status: DC

## 2021-08-22 NOTE — Telephone Encounter (Signed)
Pt needs an appt

## 2021-08-22 NOTE — Telephone Encounter (Signed)
Rx refilled. Pt notified of update.  

## 2021-08-25 ENCOUNTER — Encounter: Payer: Self-pay | Admitting: Adult Health

## 2021-08-26 ENCOUNTER — Other Ambulatory Visit: Payer: Self-pay | Admitting: Adult Health

## 2021-08-26 DIAGNOSIS — G43801 Other migraine, not intractable, with status migrainosus: Secondary | ICD-10-CM

## 2021-08-26 MED ORDER — TOPIRAMATE 100 MG PO TABS: 100.0000 mg | ORAL_TABLET | Freq: Every evening | ORAL | 0 refills | Status: AC

## 2021-09-02 DIAGNOSIS — R7309 Other abnormal glucose: Secondary | ICD-10-CM | POA: Diagnosis not present

## 2021-09-02 DIAGNOSIS — G43101 Migraine with aura, not intractable, with status migrainosus: Secondary | ICD-10-CM | POA: Diagnosis not present

## 2021-09-02 DIAGNOSIS — R7989 Other specified abnormal findings of blood chemistry: Secondary | ICD-10-CM | POA: Diagnosis not present

## 2021-09-02 DIAGNOSIS — E7800 Pure hypercholesterolemia, unspecified: Secondary | ICD-10-CM | POA: Diagnosis not present

## 2021-09-11 ENCOUNTER — Ambulatory Visit: Payer: Medicare Other | Admitting: Adult Health

## 2021-09-11 DIAGNOSIS — E7800 Pure hypercholesterolemia, unspecified: Secondary | ICD-10-CM | POA: Diagnosis not present

## 2021-09-11 DIAGNOSIS — R7989 Other specified abnormal findings of blood chemistry: Secondary | ICD-10-CM | POA: Diagnosis not present

## 2021-09-11 DIAGNOSIS — R7309 Other abnormal glucose: Secondary | ICD-10-CM | POA: Diagnosis not present

## 2021-09-18 DIAGNOSIS — Z0000 Encounter for general adult medical examination without abnormal findings: Secondary | ICD-10-CM | POA: Diagnosis not present

## 2021-09-18 DIAGNOSIS — R7309 Other abnormal glucose: Secondary | ICD-10-CM | POA: Diagnosis not present

## 2021-09-18 DIAGNOSIS — E7800 Pure hypercholesterolemia, unspecified: Secondary | ICD-10-CM | POA: Diagnosis not present

## 2021-09-18 DIAGNOSIS — M25512 Pain in left shoulder: Secondary | ICD-10-CM | POA: Diagnosis not present

## 2021-09-18 DIAGNOSIS — G43101 Migraine with aura, not intractable, with status migrainosus: Secondary | ICD-10-CM | POA: Diagnosis not present

## 2021-09-26 ENCOUNTER — Other Ambulatory Visit: Payer: Self-pay | Admitting: Adult Health

## 2021-10-27 ENCOUNTER — Encounter: Payer: Self-pay | Admitting: Gastroenterology

## 2021-10-27 ENCOUNTER — Ambulatory Visit: Payer: Medicare Other | Admitting: Gastroenterology

## 2021-10-27 VITALS — BP 138/82 | HR 88 | Ht 61.0 in | Wt 175.5 lb

## 2021-10-27 DIAGNOSIS — R1310 Dysphagia, unspecified: Secondary | ICD-10-CM | POA: Diagnosis not present

## 2021-10-27 DIAGNOSIS — K589 Irritable bowel syndrome without diarrhea: Secondary | ICD-10-CM

## 2021-10-27 MED ORDER — DICYCLOMINE HCL 10 MG PO CAPS: 10.0000 mg | ORAL_CAPSULE | Freq: Three times a day (TID) | ORAL | 3 refills | Status: DC

## 2021-10-27 NOTE — Patient Instructions (Signed)
If you are age 68 or older, your body mass index should be between 23-30. Your Body mass index is 33.16 kg/m. If this is out of the aforementioned range listed, please consider follow up with your Primary Care Provider.  If you are age 103 or younger, your body mass index should be between 19-25. Your Body mass index is 33.16 kg/m. If this is out of the aformentioned range listed, please consider follow up with your Primary Care Provider.   ________________________________________________________  The [facility] providers would like to encourage you to use [facility] to communicate with providers for non-urgent requests or questions.  Due to long hold times on the telephone, sending your provider a message by [facility] may be a faster and more efficient way to get a response.  Please allow 48 business hours for a response.  Please remember that this is for non-urgent requests.  _______________________________________________________   Lisa Morris have been scheduled for an endoscopy. Please follow written instructions given to you at your visit today. If you use inhalers (even only as needed), please bring them with you on the day of your procedure.   We will refill your Dicyclomine and send to your pharmacy  Due to recent changes in healthcare laws, you may see the results of your imaging and laboratory studies on MyChart before your provider has had a chance to review them.  We understand that in some cases there may be results that are confusing or concerning to you. Not all laboratory results come back in the same time frame and the provider may be waiting for multiple results in order to interpret others.  Please give [place] 48 hours in order for your provider to thoroughly review all the results before contacting the office for clarification of your results.    I appreciate the  opportunity to care for you  Thank You   Harl Bowie , MD

## 2021-10-27 NOTE — Progress Notes (Signed)
Lisa Morris    [id number]    [birth year]  Primary Care Physician:Pcp, No  Referring Physician: Dorothyann Peng, NP [place],  [place] [address]   Chief complaint: Dysphagia  HPI:  77 yr very pleasant female here with complaints of dysphagia.  She has been having on and off difficulty swallowing, she feels sometimes air gets stuck along with the food and she cannot swallow anything for some time she has had episodes of food impaction where she has to regurgitate back has happened with dry chicken breast and also with raw vegetables.  She never had to come to ER No heartburn or acid taste No abdominal pain.  No unintentional weight loss or decreased appetite.  She is using dicyclomine as needed with improvement of abdominal cramping and discomfort  Colonoscopy January 17, 2021: Hemorrhoids otherwise normal exam.   Outpatient Encounter Medications as of 10/27/2021  Medication Sig   atorvastatin (LIPITOR) 10 MG tablet TAKE 1 TABLET BY MOUTH  DAILY   Calcium 500 MG tablet Take 600 mg by mouth.   cyclobenzaprine (FLEXERIL) 10 MG tablet TAKE 2 TABLETS BY MOUTH IN  THE EVENING   dicyclomine (BENTYL) 10 MG capsule Take 1 capsule (10 mg total) by mouth 4 (four) times daily -  before meals and at bedtime.   fluticasone (FLONASE) 50 MCG/ACT nasal spray Place into both nostrils daily as needed.   melatonin 5 MG TABS Take 5 mg by mouth.   Naproxen Sodium (ALEVE PO) Take by mouth.   promethazine (PHENERGAN) 25 MG tablet Take 1 tablet (25 mg total) by mouth every 6 (six) hours as needed for nausea. (Patient not taking: No sig reported)   rizatriptan (MAXALT) 10 MG tablet Take 1 tablet (10 mg total) by mouth as needed. May repeat in 2 hours if needed   topiramate (TOPAMAX) 100 MG tablet Take 1 tablet (100 mg total) by mouth every evening.   venlafaxine XR (EFFEXOR-XR) 75 MG 24 hr capsule Take 3 capsules (225 mg total) by mouth at bedtime. NEED PHYSICAL EXAM FOR  FURTHER REFILLS   No facility-administered encounter medications on file as of 10/27/2021.    Allergies as of 10/27/2021   (No Known Allergies)    Past Medical History:  Diagnosis Date   Allergy    seasonal    Arthritis    Chicken pox    Complication of anesthesia    nausea   Headache(784.0)    Takes Maxlt   Hyperlipidemia    Primary osteoarthritis of right shoulder 12/19/2012   Seasonal allergies     Past Surgical History:  Procedure Laterality Date   [facility]     KNEE ARTHROSCOPY Left 12/2018   around 2019 or 2020   LIPOSUCTION     Neck   TOTAL SHOULDER ARTHROPLASTY  12/19/2012   Procedure: TOTAL SHOULDER ARTHROPLASTY;  Surgeon: [name], MD;  Location: [facility];  Service: Orthopedics;  Laterality: Right;    Family History  Problem Relation Age of Onset   Arthritis Mother    Hearing loss Mother    Hypertension Mother    Colon polyps Mother    Hearing loss Father    Hyperlipidemia Father    Hypertension Father    Stroke Father    Heart disease Maternal Grandmother    Rheumatic fever Maternal Grandmother    Colon cancer Neg Hx    Esophageal cancer Neg Hx    Rectal cancer  Neg Hx    Stomach cancer Neg Hx     Social History   Socioeconomic History   Marital status: Married    Spouse name: Not on file   Number of children: Not on file   Years of education: Not on file   Highest education level: Not on file  Occupational History   Not on file  Tobacco Use   Smoking status: Never   Smokeless tobacco: Never  Vaping Use   Vaping Use: Never used  Substance and Sexual Activity   Alcohol use: Yes    Alcohol/week: 3.0 standard drinks    Types: 3 Glasses of wine per week   Drug use: No   Sexual activity: Not on file  Other Topics Concern   Not on file  Social History Narrative   Married    Social Determinants of Health   Financial Resource Strain: Low Risk    Difficulty of Paying Living Expenses: Not hard at all  Food Insecurity: No Food  Insecurity   Worried About Charity fundraiser in the Last Year: Never true   Arboriculturist in the Last Year: Never true  Transportation Needs: No Transportation Needs   Lack of Transportation (Medical): No   Lack of Transportation (Non-Medical): No  Physical Activity: Inactive   Days of Exercise per Week: 0 days   Minutes of Exercise per Session: 0 min  Stress: Stress Concern Present   Feeling of Stress : Rather much  Social Connections: Moderately Integrated   Frequency of Communication with Friends and Family: Three times a week   Frequency of Social Gatherings with Friends and Family: Three times a week   Attends Religious Services: Never   Active Member of Clubs or Organizations: Yes   Attends Music therapist: More than 4 times per year   Marital Status: Married  Human resources officer Violence: Not At Risk   Fear of Current or Ex-Partner: No   Emotionally Abused: No   Physically Abused: No   Sexually Abused: No      Review of systems: All other review of systems negative except as mentioned in the HPI.   Physical Exam: Vitals:   10/27/21 1341  BP: 138/82  Pulse: 88   Body mass index is 33.16 kg/m. Gen:      No acute distress HEENT:  sclera anicteric Abd:      soft, non-tender; no palpable masses, no distension Ext:    No edema Neuro: alert and oriented x 3 Psych: normal mood and affect  Data Reviewed:  Reviewed labs, radiology imaging, old records and pertinent past GI work up   Assessment and Plan/Recommendations:  70 year old very pleasant female with solid dysphagia Schedule for EGD for further evaluation, esophageal biopsies and dilation as needed The risks and benefits as well as alternatives of endoscopic procedure(s) have been discussed and reviewed. All questions answered. The patient agrees to proceed.  Advised patient to avoid tough meat, raw vegetables until the procedure and to chew well to prevent any episodes of food  impaction  Irritable bowel syndrome with abdominal cramping: Use dicyclomine up to 3 times daily as needed for severe symptoms  Return after EGD as needed  This visit required 40 minutes of patient care (this includes precharting, chart review, review of results, face-to-face time used for counseling as well as treatment plan and follow-up. The patient was provided an opportunity to ask questions and all were answered. The patient agreed with the plan and demonstrated  an understanding of the instructions.  Damaris Hippo , MD    CC: Dorothyann Peng, NP

## 2021-10-31 ENCOUNTER — Encounter: Payer: Self-pay | Admitting: Gastroenterology

## 2021-10-31 DIAGNOSIS — M19012 Primary osteoarthritis, left shoulder: Secondary | ICD-10-CM | POA: Diagnosis not present

## 2021-12-04 ENCOUNTER — Ambulatory Visit (AMBULATORY_SURGERY_CENTER): Payer: Medicare Other | Admitting: Gastroenterology

## 2021-12-04 ENCOUNTER — Encounter: Payer: Self-pay | Admitting: Gastroenterology

## 2021-12-04 VITALS — BP 118/63 | HR 73 | Temp 98.0°F | Resp 12 | Ht 61.0 in | Wt 175.0 lb

## 2021-12-04 DIAGNOSIS — K2100 Gastro-esophageal reflux disease with esophagitis, without bleeding: Secondary | ICD-10-CM | POA: Diagnosis not present

## 2021-12-04 DIAGNOSIS — R1310 Dysphagia, unspecified: Secondary | ICD-10-CM

## 2021-12-04 DIAGNOSIS — K449 Diaphragmatic hernia without obstruction or gangrene: Secondary | ICD-10-CM

## 2021-12-04 DIAGNOSIS — K2289 Other specified disease of esophagus: Secondary | ICD-10-CM

## 2021-12-04 MED ORDER — PANTOPRAZOLE SODIUM 40 MG PO TBEC: 40.0000 mg | DELAYED_RELEASE_TABLET | Freq: Two times a day (BID) | ORAL | 3 refills | Status: DC

## 2021-12-04 MED ORDER — SODIUM CHLORIDE 0.9 % IV SOLN: 500.0000 mL | Freq: Once | INTRAVENOUS | Status: DC

## 2021-12-04 NOTE — Progress Notes (Signed)
Pt in recovery with monitors in place, VSS. Report given to receiving RN. Bite guard was placed with pt awake to ensure comfort. No dental or soft tissue damage noted. 

## 2021-12-04 NOTE — Progress Notes (Signed)
Called to room to assist during endoscopic procedure.  Patient ID and intended procedure confirmed with present staff. Received instructions for my participation in the procedure from the performing physician.  

## 2021-12-04 NOTE — Progress Notes (Signed)
Called to pts bedside.  She stated that her IV was painful.  On assessment IV in right AC was infiltrated.  Removed and warm pack applied to site.  IV restarted in Left hand without difficulty.

## 2021-12-04 NOTE — Progress Notes (Signed)
[facility] History and Physical   Primary Care Physician:  Pcp, No   Reason for Procedure:  Dysphagia  Plan:    EGD with possible interventions as needed     HPI: Lisa Morris is a very pleasant 70 y.o. female here for EGD for further evaluation and management of dysphagia. Denies any nausea, vomiting, abdominal pain, melena or bright red blood per rectum  The risks and benefits as well as alternatives of endoscopic procedure(s) have been discussed and reviewed. All questions answered. The patient agrees to proceed.    Past Medical History:  Diagnosis Date   Allergy    seasonal    Arthritis    Chicken pox    Complication of anesthesia    nausea   Headache(784.0)    Takes Maxlt   Hyperlipidemia    Primary osteoarthritis of right shoulder 12/19/2012   Seasonal allergies     Past Surgical History:  Procedure Laterality Date   [facility]     KNEE ARTHROSCOPY Left 12/2018   around 2019 or 2020   LIPOSUCTION     Neck   TOTAL SHOULDER ARTHROPLASTY  12/19/2012   Procedure: TOTAL SHOULDER ARTHROPLASTY;  Surgeon: [name], MD;  Location: [facility];  Service: Orthopedics;  Laterality: Right;    Prior to Admission medications   Medication Sig Start Date End Date Taking? Authorizing Provider  atorvastatin (LIPITOR) 10 MG tablet TAKE 1 TABLET BY MOUTH  DAILY 02/14/21  Yes Nafziger, Tommi Rumps, NP  Calcium 500 MG tablet Take 600 mg by mouth.   Yes [provider]  cyclobenzaprine (FLEXERIL) 10 MG tablet TAKE 2 TABLETS BY MOUTH IN  THE EVENING 08/22/21  Yes Nafziger, Tommi Rumps, NP  melatonin 5 MG TABS Take 5 mg by mouth.   Yes [provider]  Naproxen Sodium (ALEVE PO) Take by mouth.   Yes [provider]  topiramate (TOPAMAX) 100 MG tablet Take 1 tablet (100 mg total) by mouth every evening. 08/26/21  Yes Nafziger, Tommi Rumps, NP  venlafaxine XR (EFFEXOR-XR) 75 MG 24 hr capsule Take 3 capsules (225 mg total) by mouth at bedtime. NEED PHYSICAL EXAM  FOR FURTHER REFILLS 09/30/21  Yes Nafziger, Tommi Rumps, NP  dicyclomine (BENTYL) 10 MG capsule Take 1 capsule (10 mg total) by mouth 4 (four) times daily -  before meals and at bedtime. 10/27/21   Mauri Pole, MD  fluticasone (FLONASE) 50 MCG/ACT nasal spray Place into both nostrils daily as needed. Patient not taking: Reported on 12/04/2021    [provider]  promethazine (PHENERGAN) 25 MG tablet Take 1 tablet (25 mg total) by mouth every 6 (six) hours as needed for nausea. 03/05/20   Nafziger, Tommi Rumps, NP  rizatriptan (MAXALT) 10 MG tablet Take 1 tablet (10 mg total) by mouth as needed. May repeat in 2 hours if needed Patient not taking: Reported on 12/04/2021 10/07/20   Dorothyann Peng, NP    Current Outpatient Medications  Medication Sig Dispense Refill   atorvastatin (LIPITOR) 10 MG tablet TAKE 1 TABLET BY MOUTH  DAILY 90 tablet 3   Calcium 500 MG tablet Take 600 mg by mouth.     cyclobenzaprine (FLEXERIL) 10 MG tablet TAKE 2 TABLETS BY MOUTH IN  THE EVENING 60 tablet 0   melatonin 5 MG TABS Take 5 mg by mouth.     Naproxen Sodium (ALEVE PO) Take by mouth.     topiramate (TOPAMAX) 100 MG tablet Take 1 tablet (100 mg total) by mouth every evening. [place]  tablet 0   venlafaxine XR (EFFEXOR-XR) 75 MG 24 hr capsule Take 3 capsules (225 mg total) by mouth at bedtime. NEED PHYSICAL EXAM FOR FURTHER REFILLS 270 capsule 0   dicyclomine (BENTYL) 10 MG capsule Take 1 capsule (10 mg total) by mouth 4 (four) times daily -  before meals and at bedtime. 120 capsule 3   fluticasone (FLONASE) 50 MCG/ACT nasal spray Place into both nostrils daily as needed. (Patient not taking: Reported on 12/04/2021)     promethazine (PHENERGAN) 25 MG tablet Take 1 tablet (25 mg total) by mouth every 6 (six) hours as needed for nausea. 30 tablet 0   rizatriptan (MAXALT) 10 MG tablet Take 1 tablet (10 mg total) by mouth as needed. May repeat in 2 hours if needed (Patient not taking: Reported on 12/04/2021) 10 tablet 0    Current Facility-Administered Medications  Medication Dose Route Frequency Provider Last Rate Last Admin   0.9 %  sodium chloride infusion  500 mL Intravenous Once Mauri Pole, MD        Allergies as of 12/04/2021   (Not on File)    Family History  Problem Relation Age of Onset   Arthritis Mother    Hearing loss Mother    Hypertension Mother    Colon polyps Mother    Hearing loss Father    Hyperlipidemia Father    Hypertension Father    Stroke Father    Heart disease Maternal Grandmother    Rheumatic fever Maternal Grandmother    Colon cancer Neg Hx    Esophageal cancer Neg Hx    Rectal cancer Neg Hx    Stomach cancer Neg Hx     Social History   Socioeconomic History   Marital status: Married    Spouse name: Not on file   Number of children: Not on file   Years of education: Not on file   Highest education level: Not on file  Occupational History   Not on file  Tobacco Use   Smoking status: Never   Smokeless tobacco: Never  Vaping Use   Vaping Use: Never used  Substance and Sexual Activity   Alcohol use: Yes    Alcohol/week: 3.0 standard drinks    Types: 3 Glasses of wine per week   Drug use: No   Sexual activity: Not on file  Other Topics Concern   Not on file  Social History Narrative   Married    Social Determinants of Health   Financial Resource Strain: Low Risk    Difficulty of Paying Living Expenses: Not hard at all  Food Insecurity: No Food Insecurity   Worried About Charity fundraiser in the Last Year: Never true   Arboriculturist in the Last Year: Never true  Transportation Needs: No Transportation Needs   Lack of Transportation (Medical): No   Lack of Transportation (Non-Medical): No  Physical Activity: Inactive   Days of Exercise per Week: 0 days   Minutes of Exercise per Session: 0 min  Stress: Stress Concern Present   Feeling of Stress : Rather much  Social Connections: Moderately Integrated   Frequency of Communication  with Friends and Family: Three times a week   Frequency of Social Gatherings with Friends and Family: Three times a week   Attends Religious Services: Never   Active Member of Clubs or Organizations: Yes   Attends Music therapist: More than 4 times per year   Marital Status: Married  Human resources officer  Violence: Not At Risk   Fear of Current or Ex-Partner: No   Emotionally Abused: No   Physically Abused: No   Sexually Abused: No    Review of Systems:  All other review of systems negative except as mentioned in the HPI.  Physical Exam: Vital signs in last 24 hours: BP (!) 147/84   Pulse 79   Temp 98 F (36.7 C)   Ht 5\' 1"  (1.549 m)   Wt 175 lb (79.4 kg)   SpO2 99%   BMI 33.07 kg/m  General:   Alert, NAD Lungs:  Clear .   Heart:  Regular rate and rhythm Abdomen:  Soft, nontender and nondistended. Neuro/Psych:  Alert and cooperative. Normal mood and affect. A and O x 3  Reviewed labs, radiology imaging, old records and pertinent past GI work up  Patient is appropriate for planned procedure(s) and anesthesia in an ambulatory setting   K. Denzil Magnuson , MD [phone]

## 2021-12-04 NOTE — Patient Instructions (Signed)
Resume previous diet and medications. Awaiting pathology results. Repeat upper endoscopy in 2-3 months for retreatment. No aspirin, ibuprofen, naproxen or other non-steroidal anti-inflammatory drugs. Follow an anti-reflux regimen.  Use Protonix 40 MG PO BID.  YOU HAD AN ENDOSCOPIC PROCEDURE TODAY AT [facility]:   Refer to the procedure report that was given to you for any specific questions about what was found during the examination.  If the procedure report does not answer your questions, please call your gastroenterologist to clarify.  If you requested that your care partner not be given the details of your procedure findings, then the procedure report has been included in a sealed envelope for you to review at your convenience later.  YOU SHOULD EXPECT: Some feelings of bloating in the abdomen. Passage of more gas than usual.  Walking can help get rid of the air that was put into your GI tract during the procedure and reduce the bloating. If you had a lower endoscopy (such as a colonoscopy or flexible sigmoidoscopy) you may notice spotting of blood in your stool or on the toilet paper. If you underwent a bowel prep for your procedure, you may not have a normal bowel movement for a few days.  Please Note:  You might notice some irritation and congestion in your nose or some drainage.  This is from the oxygen used during your procedure.  There is no need for concern and it should clear up in a day or so.  SYMPTOMS TO REPORT IMMEDIATELY:  Following upper endoscopy (EGD)  Vomiting of blood or coffee ground material  New chest pain or pain under the shoulder blades  Painful or persistently difficult swallowing  New shortness of breath  Fever of 100F or higher  Black, tarry-looking stools  For urgent or emergent issues, a gastroenterologist can be reached at any hour by calling [phone]. Do not use MyChart messaging for urgent concerns.    DIET:  We do recommend a small  meal at first, but then you may proceed to your regular diet.  Drink plenty of fluids but you should avoid alcoholic beverages for 24 hours.  ACTIVITY:  You should plan to take it easy for the rest of today and you should NOT DRIVE or use heavy machinery until tomorrow (because of the sedation medicines used during the test).    FOLLOW UP: Our staff will call the number listed on your records 48-72 hours following your procedure to check on you and address any questions or concerns that you may have regarding the information given to you following your procedure. If we do not reach you, we will leave a message.  We will attempt to reach you two times.  During this call, we will ask if you have developed any symptoms of COVID 19. If you develop any symptoms (ie: fever, flu-like symptoms, shortness of breath, cough etc.) before then, please call [phone].  If you test positive for Covid 19 in the 2 weeks post procedure, please call and report this information to [place].    If any biopsies were taken you will be contacted by phone or by letter within the next 1-3 weeks.  Please call us at [phone] if you have not heard about the biopsies in 3 weeks.    SIGNATURES/CONFIDENTIALITY: You and/or your care partner have signed paperwork which will be entered into your electronic medical record.  These signatures attest to the fact that that the information above on your After Visit Summary  has been reviewed and is understood.  Full responsibility of the confidentiality of this discharge information lies with you and/or your care-partner.  

## 2021-12-04 NOTE — Op Note (Signed)
Lisa Morris Patient Name: Lisa Morris Procedure Date: 12/04/2021 10:42 AM MRN: [id number] Endoscopist: Mauri Pole , MD Age: 70 Referring MD:  Date of Birth: December 21, 1951 Gender: Female Account #: [id number] Procedure:                Upper GI endoscopy Indications:              Dysphagia Medicines:                Monitored Anesthesia Care Procedure:                Pre-Anesthesia Assessment:                           - Prior to the procedure, a History and Physical                            was performed, and patient medications and                            allergies were reviewed. The patient's tolerance of                            previous anesthesia was also reviewed. The risks                            and benefits of the procedure and the sedation                            options and risks were discussed with the patient.                            All questions were answered, and informed consent                            was obtained. Prior Anticoagulants: The patient has                            taken no previous anticoagulant or antiplatelet                            agents. ASA Grade Assessment: II - A patient with                            mild systemic disease. After reviewing the risks                            and benefits, the patient was deemed in                            satisfactory condition to undergo the procedure.                           After obtaining informed consent, the endoscope was  passed under direct vision. Throughout the                            procedure, the patient's blood pressure, pulse, and                            oxygen saturations were monitored continuously. The                            #4314 Olympus Endoscope was introduced through the                            mouth, and advanced to the second part of duodenum.                            The upper GI endoscopy was accomplished  without                            difficulty. The patient tolerated the procedure                            well. Scope In: Scope Out: Findings:                 LA Grade C (one or more mucosal breaks continuous                            between tops of 2 or more mucosal folds, less than                            75% circumference) esophagitis with bleeding was                            found 34 to 35 cm from the incisors. Biopsies were                            taken with a cold forceps for histology.                           A small hiatal hernia was present.                           One benign-appearing, intrinsic moderate stenosis                            was found 34 to 35 cm from the incisors. This                            stenosis measured 1.4 cm (inner diameter) x less                            than one cm (in length). The stenosis was  traversed. Dilation was not performned due to                            severe erosive esophagitis and superficial                            ulceration.                           The stomach was normal.                           The cardia and gastric fundus were normal on                            retroflexion.                           The examined duodenum was normal. Complications:            No immediate complications. Estimated Blood Loss:     Estimated blood loss was minimal. Impression:               - LA Grade C reflux esophagitis with bleeding.                            Biopsied.                           - Small hiatal hernia.                           - Benign-appearing esophageal stenosis.                           - Normal stomach.                           - Normal examined duodenum. Recommendation:           - Resume previous diet.                           - Continue present medications.                           - Await pathology results.                           - Repeat upper  endoscopy in 2-3 months for                            retreatment.                           - No aspirin, ibuprofen, naproxen, or other                            non-steroidal anti-inflammatory drugs.                           -  Follow an antireflux regimen.                           - Use Protonix (pantoprazole) 40 mg PO BID . Please                            send Rx for 90 days with 3 refills. Mauri Pole, MD 12/04/2021 11:24:17 AM This report has been signed electronically.

## 2021-12-08 ENCOUNTER — Telehealth: Payer: Self-pay

## 2021-12-08 NOTE — Telephone Encounter (Signed)
  Follow up Call-  Call back number 12/04/2021 01/17/2021  Post procedure Call Back phone  # [phone]  Permission to leave phone message Yes Yes  Some recent data might be hidden     Patient questions:  Do you have a fever, pain , or abdominal swelling? No. Pain Score  0 *  Have you tolerated food without any problems? Yes.    Have you been able to return to your normal activities? Yes.    Do you have any questions about your discharge instructions: Diet   No. Medications  No. Follow up visit  No.  Do you have questions or concerns about your Care? No.  Actions: * If pain score is 4 or above: No action needed, pain <4.  Have you developed a fever since your procedure? no  2.   Have you had an respiratory symptoms (SOB or cough) since your procedure? no  3.   Have you tested positive for COVID 19 since your procedure no  4.   Have you had any family members/close contacts diagnosed with the COVID 19 since your procedure?  no   If yes to any of these questions please route to Joylene John, RN and Joella Prince, RN

## 2021-12-08 NOTE — Telephone Encounter (Signed)
First post procedure follow up call, no answer 

## 2021-12-12 ENCOUNTER — Encounter: Payer: Self-pay | Admitting: Gastroenterology

## 2021-12-23 ENCOUNTER — Other Ambulatory Visit: Payer: Self-pay | Admitting: Adult Health

## 2022-01-20 ENCOUNTER — Other Ambulatory Visit: Payer: Self-pay | Admitting: Adult Health

## 2022-01-30 DIAGNOSIS — M19012 Primary osteoarthritis, left shoulder: Secondary | ICD-10-CM | POA: Diagnosis not present

## 2022-02-10 DIAGNOSIS — L738 Other specified follicular disorders: Secondary | ICD-10-CM | POA: Diagnosis not present

## 2022-02-10 DIAGNOSIS — D225 Melanocytic nevi of trunk: Secondary | ICD-10-CM | POA: Diagnosis not present

## 2022-02-10 DIAGNOSIS — L814 Other melanin hyperpigmentation: Secondary | ICD-10-CM | POA: Diagnosis not present

## 2022-02-10 DIAGNOSIS — L821 Other seborrheic keratosis: Secondary | ICD-10-CM | POA: Diagnosis not present

## 2022-02-10 DIAGNOSIS — D485 Neoplasm of uncertain behavior of skin: Secondary | ICD-10-CM | POA: Diagnosis not present

## 2022-04-07 ENCOUNTER — Encounter: Payer: Self-pay | Admitting: Gastroenterology

## 2022-04-15 ENCOUNTER — Other Ambulatory Visit: Payer: Self-pay | Admitting: Adult Health

## 2022-05-11 DIAGNOSIS — R051 Acute cough: Secondary | ICD-10-CM | POA: Diagnosis not present

## 2022-05-11 DIAGNOSIS — R0989 Other specified symptoms and signs involving the circulatory and respiratory systems: Secondary | ICD-10-CM | POA: Diagnosis not present

## 2022-05-15 ENCOUNTER — Other Ambulatory Visit: Payer: Self-pay | Admitting: Orthopedic Surgery

## 2022-05-15 DIAGNOSIS — Z01818 Encounter for other preprocedural examination: Secondary | ICD-10-CM

## 2022-05-15 DIAGNOSIS — M25561 Pain in right knee: Secondary | ICD-10-CM | POA: Diagnosis not present

## 2022-05-15 DIAGNOSIS — M19012 Primary osteoarthritis, left shoulder: Secondary | ICD-10-CM | POA: Diagnosis not present

## 2022-05-21 ENCOUNTER — Telehealth: Payer: Self-pay | Admitting: Gastroenterology

## 2022-05-21 NOTE — Telephone Encounter (Signed)
Patient received a letter stating she needed an OV with Dr. Silverio Decamp.  However, she does not have any available appointments through the end of July.  Patient is very upset that we sent a letter stating she needed an appointment and then didn't have any appointments to offer her.  Please call patient and advise.  Thank you.

## 2022-05-21 NOTE — Telephone Encounter (Signed)
Patient moved to 3:00 pm on 06/10/22. Patient notified.

## 2022-05-21 NOTE — Telephone Encounter (Signed)
Please offer her 06/10/22 at 3:30 pm with Dr Silverio Decamp.

## 2022-06-09 ENCOUNTER — Ambulatory Visit
Admission: RE | Admit: 2022-06-09 | Discharge: 2022-06-09 | Disposition: A | Discharge status: Home / Self Care | Payer: Medicare Other | Source: Ambulatory Visit | Attending: Orthopedic Surgery | Admitting: Orthopedic Surgery

## 2022-06-09 DIAGNOSIS — M19012 Primary osteoarthritis, left shoulder: Secondary | ICD-10-CM | POA: Diagnosis not present

## 2022-06-09 DIAGNOSIS — Z01818 Encounter for other preprocedural examination: Secondary | ICD-10-CM

## 2022-06-10 ENCOUNTER — Ambulatory Visit: Payer: Medicare Other | Admitting: Gastroenterology

## 2022-06-16 ENCOUNTER — Ambulatory Visit: Payer: Medicare Other | Admitting: Gastroenterology

## 2022-06-16 ENCOUNTER — Encounter: Payer: Self-pay | Admitting: Gastroenterology

## 2022-06-16 VITALS — BP 124/70 | HR 86 | Ht 61.0 in | Wt 180.0 lb

## 2022-06-16 DIAGNOSIS — R1319 Other dysphagia: Secondary | ICD-10-CM | POA: Diagnosis not present

## 2022-06-16 DIAGNOSIS — K2100 Gastro-esophageal reflux disease with esophagitis, without bleeding: Secondary | ICD-10-CM

## 2022-06-16 DIAGNOSIS — K589 Irritable bowel syndrome without diarrhea: Secondary | ICD-10-CM | POA: Diagnosis not present

## 2022-06-16 DIAGNOSIS — K222 Esophageal obstruction: Secondary | ICD-10-CM | POA: Diagnosis not present

## 2022-06-16 NOTE — Progress Notes (Signed)
Lisa Morris    [id number]    1951/01/12  Primary Care Physician:Pcp, No  Referring Physician: No referring provider defined for this encounter.   Chief complaint:  GERD, esophagitis  HPI:  71 yr very pleasant female here for follow-up visit for erosive esophagitis and GERD.   She does not have any typical symptoms of GERD, had only had occasional heartburn.  Since EGD she has been avoiding caffeinated drinks.  She is taking Protonix daily. Denies any episodes of food impaction or difficulty swallowing.  She mostly tries to avoid eating anything that has a texture due to personal preference.  She is not having any trouble with vegetables or chicken.  She is using dicyclomine as needed with improvement of abdominal cramping and discomfort   Colonoscopy January 17, 2021: Hemorrhoids otherwise normal exam.   EGD 12/04/21 - LA Grade C reflux esophagitis with bleeding. Biopsied. - Small hiatal hernia. - Benign-appearing esophageal stenosis. - Normal stomach. - Normal examined duodenum.   Outpatient Encounter Medications as of 06/16/2022  Medication Sig   atorvastatin (LIPITOR) 10 MG tablet Take 1 tablet (10 mg total) by mouth daily. NEED PHYSICAL EXAM FOR FURTHER REFILLS   Calcium 500 MG tablet Take 600 mg by mouth.   cyclobenzaprine (FLEXERIL) 10 MG tablet TAKE 2 TABLETS BY MOUTH IN THE EVENING (Patient taking differently: TAKE 1-2 TABLETS BY MOUTH IN  THE EVENING)   dicyclomine (BENTYL) 10 MG capsule Take 1 capsule (10 mg total) by mouth 4 (four) times daily -  before meals and at bedtime.   melatonin 5 MG TABS Take 5 mg by mouth.   mometasone (NASONEX) 50 MCG/ACT nasal spray Place 2 sprays into the nose daily.   Naproxen Sodium (ALEVE PO) Take by mouth.   pantoprazole (PROTONIX) 40 MG tablet Take 1 tablet (40 mg total) by mouth 2 (two) times daily.   promethazine (PHENERGAN) 25 MG tablet Take 1 tablet (25 mg total) by mouth every 6 (six) hours as needed for  nausea.   rizatriptan (MAXALT) 10 MG tablet Take 1 tablet (10 mg total) by mouth as needed. May repeat in 2 hours if needed   topiramate (TOPAMAX) 100 MG tablet Take 1 tablet (100 mg total) by mouth every evening.   venlafaxine XR (EFFEXOR-XR) 75 MG 24 hr capsule Take 3 capsules (225 mg total) by mouth at bedtime. NEED PHYSICAL EXAM FOR FURTHER REFILLS   [DISCONTINUED] fluticasone (FLONASE) 50 MCG/ACT nasal spray Place into both nostrils daily as needed. (Patient not taking: Reported on 12/04/2021)   No facility-administered encounter medications on file as of 06/16/2022.    Allergies as of 06/16/2022   (No Known Allergies)    Past Medical History:  Diagnosis Date   Allergy    seasonal    Arthritis    Chicken pox    Complication of anesthesia    nausea   Headache(784.0)    Takes Maxlt   Hyperlipidemia    Primary osteoarthritis of right shoulder 12/19/2012   Seasonal allergies     Past Surgical History:  Procedure Laterality Date   [facility]     KNEE ARTHROSCOPY Left 12/2018   around 2019 or 2020   LIPOSUCTION     Neck   TOTAL SHOULDER ARTHROPLASTY  12/19/2012   Procedure: TOTAL SHOULDER ARTHROPLASTY;  Surgeon: [name], MD;  Location: [place];  Service: Orthopedics;  Laterality: Right;    Family History  Problem Relation Age of  Onset   Arthritis Mother    Hearing loss Mother    Hypertension Mother    Colon polyps Mother    Hearing loss Father    Hyperlipidemia Father    Hypertension Father    Stroke Father    Heart disease Maternal Grandmother    Rheumatic fever Maternal Grandmother    Colon cancer Neg Hx    Esophageal cancer Neg Hx    Rectal cancer Neg Hx    Stomach cancer Neg Hx     Social History   Socioeconomic History   Marital status: Married    Spouse name: Not on file   Number of children: Not on file   Years of education: Not on file   Highest education level: Not on file  Occupational History   Not on file  Tobacco Use   Smoking  status: Never   Smokeless tobacco: Never  Vaping Use   Vaping Use: Never used  Substance and Sexual Activity   Alcohol use: Yes    Alcohol/week: 3.0 standard drinks of alcohol    Types: 3 Glasses of wine per week   Drug use: No   Sexual activity: Not on file  Other Topics Concern   Not on file  Social History Narrative   Married    Social Determinants of Health   Financial Resource Strain: Low Risk  (08/12/2021)   Overall Financial Resource Strain (CARDIA)    Difficulty of Paying Living Expenses: Not hard at all  Food Insecurity: No Food Insecurity (08/12/2021)   Hunger Vital Sign    Worried About Running Out of Food in the Last Year: Never true    Ran Out of Food in the Last Year: Never true  Transportation Needs: No Transportation Needs (08/12/2021)   PRAPARE - Hydrologist (Medical): No    Lack of Transportation (Non-Medical): No  Physical Activity: Inactive (08/12/2021)   Exercise Vital Sign    Days of Exercise per Week: 0 days    Minutes of Exercise per Session: 0 min  Stress: Stress Concern Present (08/12/2021)   [place]    Feeling of Stress : Rather much  Social Connections: Moderately Integrated (08/12/2021)   Social Connection and Isolation Panel [NHANES]    Frequency of Communication with Friends and Family: Three times a week    Frequency of Social Gatherings with Friends and Family: Three times a week    Attends Religious Services: Never    Active Member of Clubs or Organizations: Yes    Attends Archivist Meetings: More than 4 times per year    Marital Status: Married  Human resources officer Violence: Not At Risk (08/12/2021)   Humiliation, Afraid, Rape, and Kick questionnaire    Fear of Current or Ex-Partner: No    Emotionally Abused: No    Physically Abused: No    Sexually Abused: No      Review of systems: All other review of systems negative except as  mentioned in the HPI.   Physical Exam: Vitals:   06/16/22 1110  BP: 124/70  Pulse: 86   Body mass index is 34.01 kg/m. Gen:      No acute distress HEENT:  sclera anicteric Abd:      soft, non-tender; no palpable masses, no distension Ext:    No edema Neuro: alert and oriented x 3 Psych: normal mood and affect  Data Reviewed:  Reviewed labs, radiology imaging, old records and  pertinent past GI work up   Assessment and Plan/Recommendations:  71 year old very pleasant female with GERD and erosive esophagitis with intermittent solid dysphagia We will schedule for EGD to document healing of severe erosive esophagitis and additional esophageal dilation as needed Use pantoprazole 40 mg twice daily Antireflux measures Avoid or limit use of NSAIDs  The risks and benefits as well as alternatives of endoscopic procedure(s) have been discussed and reviewed. All questions answered. The patient agrees to proceed.   Irritable bowel syndrome with abdominal cramping: Use dicyclomine up to 3 times daily as needed for severe symptoms   Return after EGD as needed   The patient was provided an opportunity to ask questions and all were answered. The patient agreed with the plan and demonstrated an understanding of the instructions.  Damaris Hippo , MD    CC: No ref. provider found

## 2022-06-16 NOTE — Patient Instructions (Addendum)
You have been scheduled for an endoscopy. Please follow written instructions given to you at your visit today. If you use inhalers (even only as needed), please bring them with you on the day of your procedure.   Continue Protonix  Gastroesophageal Reflux Disease, Adult Gastroesophageal reflux (GER) happens when acid from the stomach flows up into the tube that connects the mouth and the stomach (esophagus). Normally, food travels down the esophagus and stays in the stomach to be digested. However, when a person has GER, food and stomach acid sometimes move back up into the esophagus. If this becomes a more serious problem, the person may be diagnosed with a disease called gastroesophageal reflux disease (GERD). GERD occurs when the reflux: Happens often. Causes frequent or severe symptoms. Causes problems such as damage to the esophagus. When stomach acid comes in contact with the esophagus, the acid may cause inflammation in the esophagus. Over time, GERD may create small holes (ulcers) in the lining of the esophagus. What are the causes? This condition is caused by a problem with the muscle between the esophagus and the stomach (lower esophageal sphincter, or LES). Normally, the LES muscle closes after food passes through the esophagus to the stomach. When the LES is weakened or abnormal, it does not close properly, and that allows food and stomach acid to go back up into the esophagus. The LES can be weakened by certain dietary substances, medicines, and medical conditions, including: Tobacco use. Pregnancy. Having a hiatal hernia. Alcohol use. Certain foods and beverages, such as coffee, chocolate, onions, and peppermint. What increases the risk? You are more likely to develop this condition if you: Have an increased body weight. Have a connective tissue disorder. Take NSAIDs, such as ibuprofen. What are the signs or symptoms? Symptoms of this condition include: Heartburn. Difficult  or painful swallowing and the feeling of having a lump in the throat. A bitter taste in the mouth. Bad breath and having a large amount of saliva. Having an upset or bloated stomach and belching. Chest pain. Different conditions can cause chest pain. Make sure you see your health care provider if you experience chest pain. Shortness of breath or wheezing. Ongoing (chronic) cough or a nighttime cough. Wearing away of tooth enamel. Weight loss. How is this diagnosed? This condition may be diagnosed based on a medical history and a physical exam. To determine if you have mild or severe GERD, your health care provider may also monitor how you respond to treatment. You may also have tests, including: A test to examine your stomach and esophagus with a small camera (endoscopy). A test that measures the acidity level in your esophagus. A test that measures how much pressure is on your esophagus. A barium swallow or modified barium swallow test to show the shape, size, and functioning of your esophagus. How is this treated? Treatment for this condition may vary depending on how severe your symptoms are. Your health care provider may recommend: Changes to your diet. Medicine. Surgery. The goal of treatment is to help relieve your symptoms and to prevent complications. Follow these instructions at home: Eating and drinking  Follow a diet as recommended by your health care provider. This may involve avoiding foods and drinks such as: Coffee and tea, with or without caffeine. Drinks that contain alcohol. Energy drinks and sports drinks. Carbonated drinks or sodas. Chocolate and cocoa. Peppermint and mint flavorings. Garlic and onions. Horseradish. Spicy and acidic foods, including peppers, chili powder, curry powder, vinegar, hot sauces,  and barbecue sauce. Citrus fruit juices and citrus fruits, such as oranges, lemons, and limes. Tomato-based foods, such as red sauce, chili, salsa, and pizza  with red sauce. Fried and fatty foods, such as donuts, french fries, potato chips, and high-fat dressings. High-fat meats, such as hot dogs and fatty cuts of red and white meats, such as rib eye steak, sausage, ham, and bacon. High-fat dairy items, such as whole milk, butter, and cream cheese. Eat small, frequent meals instead of large meals. Avoid drinking large amounts of liquid with your meals. Avoid eating meals during the 2-3 hours before bedtime. Avoid lying down right after you eat. Do not exercise right after you eat. Lifestyle  Do not use any products that contain nicotine or tobacco. These products include cigarettes, chewing tobacco, and vaping devices, such as e-cigarettes. If you need help quitting, ask your health care provider. Try to reduce your stress by using methods such as yoga or meditation. If you need help reducing stress, ask your health care provider. If you are overweight, reduce your weight to an amount that is healthy for you. Ask your health care provider for guidance about a safe weight loss goal. General instructions Pay attention to any changes in your symptoms. Take over-the-counter and prescription medicines only as told by your health care provider. Do not take aspirin, ibuprofen, or other NSAIDs unless your health care provider told you to take these medicines. Wear loose-fitting clothing. Do not wear anything tight around your waist that causes pressure on your abdomen. Raise (elevate) the head of your bed about 6 inches (15 cm). You can use a wedge to do this. Avoid bending over if this makes your symptoms worse. Keep all follow-up visits. This is important. Contact a health care provider if: You have: New symptoms. Unexplained weight loss. Difficulty swallowing or it hurts to swallow. Wheezing or a persistent cough. A hoarse voice. Your symptoms do not improve with treatment. Get help right away if: You have sudden pain in your arms, neck, jaw,  teeth, or back. You suddenly feel sweaty, dizzy, or light-headed. You have chest pain or shortness of breath. You vomit and the vomit is green, yellow, or black, or it looks like blood or coffee grounds. You faint. You have stool that is red, bloody, or black. You cannot swallow, drink, or eat. These symptoms may represent a serious problem that is an emergency. Do not wait to see if the symptoms will go away. Get medical help right away. Call your local emergency services (911 in the [place] Do not drive yourself to the hospital. Summary Gastroesophageal reflux happens when acid from the stomach flows up into the esophagus. GERD is a disease in which the reflux happens often, causes frequent or severe symptoms, or causes problems such as damage to the esophagus. Treatment for this condition may vary depending on how severe your symptoms are. Your health care provider may recommend diet and lifestyle changes, medicine, or surgery. Contact a health care provider if you have new or worsening symptoms. Take over-the-counter and prescription medicines only as told by your health care provider. Do not take aspirin, ibuprofen, or other NSAIDs unless your health care provider told you to do so. Keep all follow-up visits as told by your health care provider. This is important. This information is not intended to replace advice given to you by your health care provider. Make sure you discuss any questions you have with your health care provider. Document Revised: 06/24/2020 Document Reviewed: 06/24/2020  Elsevier Patient Education  [employer].  If you are age 61 or older, your body mass index should be between 23-30. Your Body mass index is 34.01 kg/m. If this is out of the aforementioned range listed, please consider follow up with your Primary Care Provider.  If you are age 23 or younger, your body mass index should be between 19-25. Your Body mass index is 34.01 kg/m. If this is out of the  aformentioned range listed, please consider follow up with your Primary Care Provider.   ________________________________________________________  The [facility] providers would like to encourage you to use [facility] to communicate with providers for non-urgent requests or questions.  Due to long hold times on the telephone, sending your provider a message by [facility] may be a faster and more efficient way to get a response.  Please allow 48 business hours for a response.  Please remember that this is for non-urgent requests.  _______________________________________________________   Due to recent changes in healthcare laws, you may see the results of your imaging and laboratory studies on MyChart before your provider has had a chance to review them.  We understand that in some cases there may be results that are confusing or concerning to you. Not all laboratory results come back in the same time frame and the provider may be waiting for multiple results in order to interpret others.  Please give [place] 48 hours in order for your provider to thoroughly review all the results before contacting the office for clarification of your results.    Thank you for choosing [facility]  Karleen Hampshire Nand[place]am,MD

## 2022-06-24 ENCOUNTER — Encounter: Payer: Self-pay | Admitting: Gastroenterology

## 2022-07-09 ENCOUNTER — Encounter: Payer: Self-pay | Admitting: Gastroenterology

## 2022-07-09 ENCOUNTER — Ambulatory Visit (AMBULATORY_SURGERY_CENTER): Payer: Medicare Other | Admitting: Gastroenterology

## 2022-07-09 VITALS — BP 135/78 | HR 76 | Temp 98.0°F | Resp 20 | Ht 61.0 in | Wt 180.0 lb

## 2022-07-09 DIAGNOSIS — K2100 Gastro-esophageal reflux disease with esophagitis, without bleeding: Secondary | ICD-10-CM | POA: Diagnosis not present

## 2022-07-09 DIAGNOSIS — R1310 Dysphagia, unspecified: Secondary | ICD-10-CM

## 2022-07-09 DIAGNOSIS — K449 Diaphragmatic hernia without obstruction or gangrene: Secondary | ICD-10-CM

## 2022-07-09 DIAGNOSIS — K222 Esophageal obstruction: Secondary | ICD-10-CM | POA: Diagnosis not present

## 2022-07-09 MED ORDER — SODIUM CHLORIDE 0.9 % IV SOLN: 500.0000 mL | Freq: Once | INTRAVENOUS | Status: DC

## 2022-07-09 NOTE — Progress Notes (Signed)
Please refer to office visit note 06/16/22. No additional changes in H&P Patient is appropriate for planned procedure(s) and anesthesia in an ambulatory setting  K. Denzil Magnuson , MD [phone]

## 2022-07-09 NOTE — Patient Instructions (Signed)
YOU HAD AN ENDOSCOPIC PROCEDURE TODAY: Refer to the procedure report and other information in the discharge instructions given to you for any specific questions about what was found during the examination. If this information does not answer your questions, please call [place] office at [phone] to clarify.   YOU SHOULD EXPECT: Some feelings of bloating in the abdomen. Passage of more gas than usual. Walking can help get rid of the air that was put into your GI tract during the procedure and reduce the bloating. If you had a lower endoscopy (such as a colonoscopy or flexible sigmoidoscopy) you may notice spotting of blood in your stool or on the toilet paper. Some abdominal soreness may be present for a day or two, also.  DIET: Your first meal following the procedure should be a light meal and then it is ok to progress to your normal diet. A half-sandwich or bowl of soup is an example of a good first meal. Heavy or fried foods are harder to digest and may make you feel nauseous or bloated. Drink plenty of fluids but you should avoid alcoholic beverages for 24 hours. If you had a esophageal dilation, please see attached instructions for diet.    ACTIVITY: Your care partner should take you home directly after the procedure. You should plan to take it easy, moving slowly for the rest of the day. You can resume normal activity the day after the procedure however YOU SHOULD NOT DRIVE, use power tools, machinery or perform tasks that involve climbing or major physical exertion for 24 hours (because of the sedation medicines used during the test).   SYMPTOMS TO REPORT IMMEDIATELY: A gastroenterologist can be reached at any hour. Please call [phone]  for any of the following symptoms:   Following upper endoscopy (EGD, EUS, ERCP, esophageal dilation) Vomiting of blood or coffee ground material  New, significant abdominal pain  New, significant chest pain or pain under the shoulder blades  Painful or  persistently difficult swallowing  New shortness of breath  Black, tarry-looking or red, bloody stools  FOLLOW UP:  If any biopsies were taken you will be contacted by phone or by letter within the next 1-3 weeks. Call [phone]  if you have not heard about the biopsies in 3 weeks.  Please also call with any specific questions about appointments or follow up tests.  

## 2022-07-09 NOTE — Progress Notes (Signed)
Pt in recovery with monitors in place, VSS. Report given to receiving RN. Bite guard was placed with pt awake to ensure comfort. No dental or soft tissue damage noted. 

## 2022-07-09 NOTE — Progress Notes (Signed)
Called to room to assist during endoscopic procedure.  Patient ID and intended procedure confirmed with present staff. Received instructions for my participation in the procedure from the performing physician.  

## 2022-07-09 NOTE — Op Note (Signed)
[address] Patient Name: Lisa Morris Procedure Date: 07/09/2022 7:27 AM MRN: [id number] Endoscopist: Mauri Pole , MD Age: 72 Referring MD:  Date of Birth: Mar 24, 1951 Gender: Female Account #: [id number] Procedure:                Upper GI endoscopy Indications:              Dysphagia, Follow-up of reflux esophagitis Medicines:                Monitored Anesthesia Care Procedure:                Pre-Anesthesia Assessment:                           - Prior to the procedure, a History and Physical                            was performed, and patient medications and                            allergies were reviewed. The patient's tolerance of                            previous anesthesia was also reviewed. The risks                            and benefits of the procedure and the sedation                            options and risks were discussed with the patient.                            All questions were answered, and informed consent                            was obtained. Prior Anticoagulants: The patient has                            taken no previous anticoagulant or antiplatelet                            agents. ASA Grade Assessment: II - A patient with                            mild systemic disease. After reviewing the risks                            and benefits, the patient was deemed in                            satisfactory condition to undergo the procedure.                           After obtaining informed consent, the endoscope was  passed under direct vision. Throughout the                            procedure, the patient's blood pressure, pulse, and                            oxygen saturations were monitored continuously. The                            GIF [id number] #[id number] was introduced through the                            mouth, and advanced to the second part of duodenum.                            The upper GI  endoscopy was accomplished without                            difficulty. The patient tolerated the procedure                            well. Scope In: Scope Out: Findings:                 One benign-appearing, intrinsic mild stenosis was                            found 37 to 38 cm from the incisors. This stenosis                            measured 1.6 cm (inner diameter) x less than one cm                            (in length). The stenosis was traversed. The scope                            was withdrawn. Dilation was performed with a                            Maloney dilator with mild resistance at 52 Fr. The                            dilation site was examined following endoscope                            reinsertion and showed mild mucosal disruption.                           A small hiatal hernia was present.                           The stomach was normal.                           The  cardia and gastric fundus were normal on                            retroflexion.                           Patchy mildly congested mucosa without active                            bleeding and with no stigmata of bleeding was found                            in the first portion of the duodenum and in the                            second portion of the duodenum. Biopsies were taken                            with a cold forceps for histology. Complications:            No immediate complications. Estimated Blood Loss:     Estimated blood loss was minimal. Impression:               - Benign-appearing esophageal stenosis. Dilated.                           - Small hiatal hernia.                           - Normal stomach.                           - Congested duodenal mucosa. Biopsied. Recommendation:           - Resume previous diet.                           - Continue present medications.                           - Await pathology results.                           - Return to GI  office in 6 months. Mauri Pole, MD 07/09/2022 8:32:57 AM This report has been signed electronically.

## 2022-07-10 ENCOUNTER — Telehealth: Payer: Self-pay | Admitting: *Deleted

## 2022-07-10 NOTE — Telephone Encounter (Signed)
  Follow up Call-     07/09/2022    7:20 AM 12/04/2021    9:40 AM 01/17/2021    2:24 PM  Call back number  Post procedure Call Back phone  # [phone]  Permission to leave phone message Yes Yes Yes     Patient questions:  Do you have a fever, pain , or abdominal swelling? No. Pain Score  0 *  Have you tolerated food without any problems? Yes.    Have you been able to return to your normal activities? Yes.    Do you have any questions about your discharge instructions: Diet   No. Medications  No. Follow up visit  No.  Do you have questions or concerns about your Care? No.  Actions: * If pain score is 4 or above: No action needed, pain <4.

## 2022-07-13 DIAGNOSIS — M19012 Primary osteoarthritis, left shoulder: Secondary | ICD-10-CM | POA: Diagnosis not present

## 2022-07-21 ENCOUNTER — Encounter: Payer: Self-pay | Admitting: Gastroenterology

## 2022-07-22 ENCOUNTER — Other Ambulatory Visit (HOSPITAL_COMMUNITY): Payer: Medicare Other

## 2022-07-22 NOTE — Care Plan (Signed)
Ortho Bundle Case Management Note  Patient Details  Name: Lisa Morris MRN: [id number] Date of Birth: [birth year]    Met with patient in the office prior to surgery. She will discharge to home with family to assist. No DME or therapy needs at this time. OPPT will be arranged at 4 weeks post op. Patient and MD in agreement with plan.                  DME Arranged:    DME Agency:     HH Arranged:    HH Agency:     Additional Comments: Please contact me with any questions of if this plan should need to change.  Ladell Heads,  [place] Specialist  [phone] 07/22/2022, 2:55 PM

## 2022-07-22 NOTE — Patient Instructions (Signed)
DUE TO COVID-19 ONLY TWO VISITORS  (aged 69 and older)  ARE ALLOWED TO COME WITH YOU AND STAY IN THE WAITING ROOM ONLY DURING PRE OP AND PROCEDURE.   **NO VISITORS ARE ALLOWED IN THE SHORT STAY AREA OR RECOVERY ROOM!!**  IF YOU WILL BE ADMITTED INTO THE HOSPITAL YOU ARE ALLOWED ONLY FOUR SUPPORT PEOPLE DURING VISITATION HOURS ONLY (7 AM -8PM)   The support person(s) must pass our screening, gel in and out, and wear a mask at all times, including in the patient's room. Patients must also wear a mask when staff or their support person are in the room. Visitors GUEST BADGE MUST BE WORN VISIBLY  One adult visitor may remain with you overnight and MUST be in the room by 8 P.M.     Your procedure is scheduled on: 08/04/22   Report to [facility]    Report to admitting at 5:15 4AM   Call this number if you have problems the morning of surgery [phone]   Do not eat food :After Midnight.   After Midnight you may have the following liquids until _4:30_____ AM/  DAY OF SURGERY  Water Black Coffee (sugar ok, NO MILK/CREAM OR CREAMERS)  Tea (sugar ok, NO MILK/CREAM OR CREAMERS) regular and decaf                             Plain Jell-O (NO RED)                                           Fruit ices (not with fruit pulp, NO RED)                                     Popsicles (NO RED)                                                                  Juice: apple, WHITE grape, WHITE cranberry Sports drinks like Gatorade (NO RED)              Drink 2 Ensure/G2 drinks AT 10:00 PM the night before surgery.        The day of surgery:  Drink ONE (1) Pre-Surgery Clear Ensure at 4:15 AM the morning of surgery. Drink in one sitting. Do not sip.  This drink was given to you during your hospital  pre-op appointment visit. Nothing else to drink after completing the  Pre-Surgery Clear Ensure at 4:30 AM          If you have questions, please contact your surgeon's office.        Oral Hygiene is also important to reduce your risk of infection.                                    Remember - BRUSH YOUR TEETH THE MORNING OF SURGERY WITH YOUR REGULAR TOOTHPASTE   Do NOT smoke after Midnight   Take these medicines the  morning of surgery with A SIP OF WATER: Topamax, Venlafaxine, Lipitor, pantoprazole  Bring CPAP mask and tubing day of surgery.                              You may not have any metal on your body including hair pins, jewelry, and body piercing             Do not wear make-up, lotions, powders, perfumes/cologne, or deodorant  Do not wear nail polish including gel and S&S, artificial/acrylic nails, or any other type of covering on natural nails including finger and toenails. If you have artificial nails, gel coating, etc. that needs to be removed by a nail salon please have this removed prior to surgery or surgery may need to be canceled/ delayed if the surgeon/ anesthesia feels like they are unable to be safely monitored.   Do not shave  48 hours prior to surgery.     Do not bring valuables to the hospital. [place].   Contacts, dentures or bridgework may not be worn into surgery.     Patients discharged on the day of surgery will not be allowed to drive home.  Someone NEEDS to stay with you for the first 24 hours after anesthesia.   Special Instructions: Bring a copy of your healthcare power of attorney and living will documents  the day of surgery if you haven't scanned them before.              Please read over the following fact sheets you were given: IF YOU HAVE QUESTIONS ABOUT YOUR PRE-OP INSTRUCTIONS PLEASE CALL [facility]- Preparing for Total Shoulder Arthroplasty    Before surgery, you can play an important role. Because skin is not sterile, your skin needs to be as free of germs as possible. You can reduce the number of germs on your skin by using the following  products. Benzoyl Peroxide Gel Reduces the number of germs present on the skin Applied twice a day to shoulder area starting two days before surgery    ==================================================================  Please follow these instructions carefully:  BENZOYL PEROXIDE 5% GEL  Please do not use if you have an allergy to benzoyl peroxide.   If your skin becomes reddened/irritated stop using the benzoyl peroxide.  Starting two days before surgery, apply as follows: Apply benzoyl peroxide in the morning and at night. Apply after taking a shower. If you are not taking a shower clean entire shoulder front, back, and side along with the armpit with a clean wet washcloth.  Place a quarter-sized dollop on your shoulder and rub in thoroughly, making sure to cover the front, back, and side of your shoulder, along with the armpit.   2 days before ____ AM   ____ PM              1 day before ____ AM   ____ PM                         Do this twice a day for two days.  (Last application is the night before surgery, AFTER using the CHG soap as described below).  Do NOT apply benzoyl peroxide gel on the day of surgery.      [facility] - Preparing for Surgery  Before surgery, you can play an important role.  Because skin is not sterile, your skin needs to be as free of germs as possible.  You can reduce the number of germs on your skin by washing with CHG (chlorahexidine gluconate) soap before surgery.  CHG is an antiseptic cleaner which kills germs and bonds with the skin to continue killing germs even after washing. Please DO NOT use if you have an allergy to CHG or antibacterial soaps.  If your skin becomes reddened/irritated stop using the CHG and inform your nurse when you arrive at Short Stay. Do not shave (including legs and underarms) for at least 48 hours prior to the first CHG shower.  You may shave your face/neck. Please follow these instructions carefully:  1.  Shower with CHG Soap  the night before surgery and the  morning of Surgery.  2.  If you choose to wash your hair, wash your hair first as usual with your  normal  shampoo.  3.  After you shampoo, rinse your hair and body thoroughly to remove the  shampoo.                            4.  Use CHG as you would any other liquid soap.  You can apply chg directly  to the skin and wash                       Gently with a scrungie or clean washcloth.  5.  Apply the CHG Soap to your body ONLY FROM THE NECK DOWN.   Do not use on face/ open                           Wound or open sores. Avoid contact with eyes, ears mouth and genitals (private parts).                       Wash face,  Genitals (private parts) with your normal soap.             6.  Wash thoroughly, paying special attention to the area where your surgery  will be performed.  7.  Thoroughly rinse your body with warm water from the neck down.  8.  DO NOT shower/wash with your normal soap after using and rinsing off  the CHG Soap.                9.  Pat yourself dry with a clean towel.            10.  Wear clean pajamas.            11.  Place clean sheets on your bed the night of your first shower and do not  sleep with pets. Day of Surgery : Do not apply any lotions/deodorants the morning of surgery.  Please wear clean clothes to the hospital/surgery center.  FAILURE TO FOLLOW THESE INSTRUCTIONS MAY RESULT IN THE CANCELLATION OF YOUR SURGERY  ________________________________________________________________________   Incentive Spirometer  An incentive spirometer is a tool that can help keep your lungs clear and active. This tool measures how well you are filling your lungs with each breath. Taking long deep breaths may help reverse or decrease the chance of developing breathing (pulmonary) problems (especially infection) following: A long period of time when you are unable to move or be active. BEFORE THE  PROCEDURE  If the spirometer includes an indicator to  show your best effort, your nurse or respiratory therapist will set it to a desired goal. If possible, sit up straight or lean slightly forward. Try not to slouch. Hold the incentive spirometer in an upright position. INSTRUCTIONS FOR USE  Sit on the edge of your bed if possible, or sit up as far as you can in bed or on a chair. Hold the incentive spirometer in an upright position. Breathe out normally. Place the mouthpiece in your mouth and seal your lips tightly around it. Breathe in slowly and as deeply as possible, raising the piston or the ball toward the top of the column. Hold your breath for 3-5 seconds or for as long as possible. Allow the piston or ball to fall to the bottom of the column. Remove the mouthpiece from your mouth and breathe out normally. Rest for a few seconds and repeat Steps 1 through 7 at least 10 times every 1-2 hours when you are awake. Take your time and take a few normal breaths between deep breaths. The spirometer may include an indicator to show your best effort. Use the indicator as a goal to work toward during each repetition. After each set of 10 deep breaths, practice coughing to be sure your lungs are clear. If you have an incision (the cut made at the time of surgery), support your incision when coughing by placing a pillow or rolled up towels firmly against it. Once you are able to get out of bed, walk around indoors and cough well. You may stop using the incentive spirometer when instructed by your caregiver.  RISKS AND COMPLICATIONS Take your time so you do not get dizzy or light-headed. If you are in pain, you may need to take or ask for pain medication before doing incentive spirometry. It is harder to take a deep breath if you are having pain. AFTER USE Rest and breathe slowly and easily. It can be helpful to keep track of a log of your progress. Your caregiver can provide you with a simple table to help with this. If you are using the spirometer at  home, follow these instructions: [place] IF:  You are having difficultly using the spirometer. You have trouble using the spirometer as often as instructed. Your pain medication is not giving enough relief while using the spirometer. You develop fever of 100.5 F (38.1 C) or higher. SEEK IMMEDIATE MEDICAL CARE IF:  You cough up bloody sputum that had not been present before. You develop fever of 102 F (38.9 C) or greater. You develop worsening pain at or near the incision site. MAKE SURE YOU:  Understand these instructions. Will watch your condition. Will get help right away if you are not doing well or get worse. Document Released: 04/26/2007 Document Revised: 03/07/2012 Document Reviewed: 06/27/2007 [facility] Patient Information 2014 [place], [place].   ________________________________________________________________________

## 2022-07-23 ENCOUNTER — Other Ambulatory Visit: Payer: Self-pay

## 2022-07-23 ENCOUNTER — Encounter (HOSPITAL_COMMUNITY): Payer: Self-pay

## 2022-07-23 ENCOUNTER — Encounter (HOSPITAL_COMMUNITY)
Admission: RE | Admit: 2022-07-23 | Discharge: 2022-07-23 | Disposition: A | Discharge status: Home / Self Care | Payer: Medicare Other | Source: Ambulatory Visit | Attending: Orthopedic Surgery | Admitting: Orthopedic Surgery

## 2022-07-23 DIAGNOSIS — Z01812 Encounter for preprocedural laboratory examination: Secondary | ICD-10-CM | POA: Insufficient documentation

## 2022-07-23 DIAGNOSIS — Z01818 Encounter for other preprocedural examination: Secondary | ICD-10-CM

## 2022-07-23 HISTORY — DX: Personal history of other diseases of the digestive system: Z87.19

## 2022-07-23 LAB — CBC
HCT: 42.8 % (ref 36.0–46.0)
Hemoglobin: 14 g/dL (ref 12.0–15.0)
MCH: 29.3 pg (ref 26.0–34.0)
MCHC: 32.7 g/dL (ref 30.0–36.0)
MCV: 89.5 fL (ref 80.0–100.0)
Platelets: 188 10*3/uL (ref 150–400)
RBC: 4.78 MIL/uL (ref 3.87–5.11)
RDW: 14.6 % (ref 11.5–15.5)
WBC: 5.9 10*3/uL (ref 4.0–10.5)
nRBC: 0 % (ref 0.0–0.2)

## 2022-07-23 LAB — BASIC METABOLIC PANEL
Anion gap: 6 (ref 5–15)
BUN: 17 mg/dL (ref 8–23)
CO2: 25 mmol/L (ref 22–32)
Calcium: 9.3 mg/dL (ref 8.9–10.3)
Chloride: 110 mmol/L (ref 98–111)
Creatinine, Ser: 0.82 mg/dL (ref 0.44–1.00)
GFR, Estimated: >60 mL/min (ref >60)
Glucose, Bld: 91 mg/dL (ref 70–99)
Potassium: 4.2 mmol/L (ref 3.5–5.1)
Sodium: 141 mmol/L (ref 135–145)

## 2022-07-23 LAB — SURGICAL PCR SCREEN
MRSA, PCR: NEGATIVE
Staphylococcus aureus: NEGATIVE

## 2022-07-23 NOTE — Progress Notes (Signed)
Anesthesia note:  Bowel prep reminder:NA  PCP - [facility] Cardiologist -none Other-   Chest x-ray - no EKG - no Stress Test - no ECHO - no Cardiac Cath - no  Pacemaker/ICD device last checked:NA  Sleep Study - no CPAP -   Pt is pre diabetic-NA Fasting Blood Sugar -  Checks Blood Sugar _____  Blood Thinner:NA Blood Thinner Instructions: Aspirin Instructions: Last Dose:  Anesthesia review: no  Patient denies shortness of breath, fever, cough and chest pain at PAT appointment  Pt has no SOB with activities.  Patient verbalized understanding of instructions that were given to them at the PAT appointment. Patient was also instructed that they will need to review over the PAT instructions again at home before surgery. yes

## 2022-07-28 DIAGNOSIS — Z01818 Encounter for other preprocedural examination: Secondary | ICD-10-CM | POA: Diagnosis not present

## 2022-07-28 DIAGNOSIS — M25512 Pain in left shoulder: Secondary | ICD-10-CM | POA: Diagnosis not present

## 2022-08-03 NOTE — Anesthesia Preprocedure Evaluation (Signed)
Anesthesia Evaluation  Patient identified by MRN, date of birth, ID band Patient awake    Reviewed: Allergy & Precautions, NPO status , Patient's Chart, lab work & pertinent test results  History of Anesthesia Complications (+) PONV and history of anesthetic complications  Airway Mallampati: II  TM Distance: >3 FB Neck ROM: Full    Dental  (+) Teeth Intact, Dental Advisory Given   Pulmonary neg pulmonary ROS,    Pulmonary exam normal breath sounds clear to auscultation       Cardiovascular negative cardio ROS Normal cardiovascular exam Rhythm:Regular Rate:Normal     Neuro/Psych  Headaches,    GI/Hepatic Neg liver ROS, hiatal hernia, GERD  Medicated and Controlled,  Endo/Other  Obesity   Renal/GU negative Renal ROS     Musculoskeletal  (+) Arthritis ,   Abdominal   Peds  Hematology negative hematology ROS (+)   Anesthesia Other Findings   Reproductive/Obstetrics                            Anesthesia Physical Anesthesia Plan  ASA: 2  Anesthesia Plan: General   Post-op Pain Management: Regional block* and Tylenol PO (pre-op)*   Induction: Intravenous  PONV Risk Score and Plan: 4 or greater and Midazolam, Dexamethasone, Ondansetron and Scopolamine patch - Pre-op  Airway Management Planned: Oral ETT  Additional Equipment:   Intra-op Plan:   Post-operative Plan: Extubation in OR  Informed Consent: I have reviewed the patients History and Physical, chart, labs and discussed the procedure including the risks, benefits and alternatives for the proposed anesthesia with the patient or authorized representative who has indicated his/her understanding and acceptance.     Dental advisory given  Plan Discussed with: CRNA  Anesthesia Plan Comments:        Anesthesia Quick Evaluation

## 2022-08-04 ENCOUNTER — Encounter (HOSPITAL_COMMUNITY): Payer: Self-pay | Admitting: Orthopedic Surgery

## 2022-08-04 ENCOUNTER — Ambulatory Visit (HOSPITAL_COMMUNITY): Payer: Medicare Other | Admitting: Anesthesiology

## 2022-08-04 ENCOUNTER — Ambulatory Visit (HOSPITAL_COMMUNITY): Payer: Medicare Other

## 2022-08-04 ENCOUNTER — Other Ambulatory Visit: Payer: Self-pay

## 2022-08-04 ENCOUNTER — Encounter (HOSPITAL_COMMUNITY)
Admission: RE | Disposition: A | Discharge status: Home / Self Care | Payer: Self-pay | Source: Home / Self Care | Attending: Orthopedic Surgery

## 2022-08-04 ENCOUNTER — Ambulatory Visit (HOSPITAL_BASED_OUTPATIENT_CLINIC_OR_DEPARTMENT_OTHER): Payer: Medicare Other | Admitting: Anesthesiology

## 2022-08-04 ENCOUNTER — Ambulatory Visit (HOSPITAL_COMMUNITY)
Admission: RE | Admit: 2022-08-04 | Discharge: 2022-08-04 | Disposition: A | Discharge status: Home / Self Care | Payer: Medicare Other | Attending: Orthopedic Surgery | Admitting: Orthopedic Surgery

## 2022-08-04 DIAGNOSIS — M1612 Unilateral primary osteoarthritis, left hip: Secondary | ICD-10-CM

## 2022-08-04 DIAGNOSIS — Z6832 Body mass index (BMI) 32.0-32.9, adult: Secondary | ICD-10-CM | POA: Insufficient documentation

## 2022-08-04 DIAGNOSIS — G8918 Other acute postprocedural pain: Secondary | ICD-10-CM | POA: Diagnosis not present

## 2022-08-04 DIAGNOSIS — K219 Gastro-esophageal reflux disease without esophagitis: Secondary | ICD-10-CM | POA: Insufficient documentation

## 2022-08-04 DIAGNOSIS — Z8719 Personal history of other diseases of the digestive system: Secondary | ICD-10-CM | POA: Insufficient documentation

## 2022-08-04 DIAGNOSIS — E669 Obesity, unspecified: Secondary | ICD-10-CM | POA: Insufficient documentation

## 2022-08-04 DIAGNOSIS — M19012 Primary osteoarthritis, left shoulder: Secondary | ICD-10-CM | POA: Insufficient documentation

## 2022-08-04 DIAGNOSIS — Z471 Aftercare following joint replacement surgery: Secondary | ICD-10-CM | POA: Diagnosis not present

## 2022-08-04 DIAGNOSIS — Z96612 Presence of left artificial shoulder joint: Secondary | ICD-10-CM | POA: Diagnosis not present

## 2022-08-04 DIAGNOSIS — Z01818 Encounter for other preprocedural examination: Secondary | ICD-10-CM

## 2022-08-04 HISTORY — PX: TOTAL SHOULDER ARTHROPLASTY: SHX126

## 2022-08-04 SURGERY — ARTHROPLASTY, SHOULDER, TOTAL
Anesthesia: General | Site: Shoulder | Laterality: Left

## 2022-08-04 MED ORDER — CHLORHEXIDINE GLUCONATE 0.12 % MT SOLN
15.0000 mL | Freq: Once | OROMUCOSAL | Status: AC
  Administered 2022-08-04: 15 mL via OROMUCOSAL

## 2022-08-04 MED ORDER — ONDANSETRON HCL 4 MG/2ML IJ SOLN
INTRAMUSCULAR | Status: AC
  Filled 2022-08-04: qty 2

## 2022-08-04 MED ORDER — PROPOFOL 10 MG/ML IV BOLUS
INTRAVENOUS | Status: AC
  Filled 2022-08-04: qty 20

## 2022-08-04 MED ORDER — ROCURONIUM BROMIDE 10 MG/ML (PF) SYRINGE
PREFILLED_SYRINGE | INTRAVENOUS | Status: DC | PRN
  Administered 2022-08-04: 60 mg via INTRAVENOUS

## 2022-08-04 MED ORDER — PHENYLEPHRINE 80 MCG/ML (10ML) SYRINGE FOR IV PUSH (FOR BLOOD PRESSURE SUPPORT)
PREFILLED_SYRINGE | INTRAVENOUS | Status: AC
  Filled 2022-08-04: qty 10

## 2022-08-04 MED ORDER — DEXAMETHASONE SODIUM PHOSPHATE 10 MG/ML IJ SOLN
INTRAMUSCULAR | Status: AC
  Filled 2022-08-04: qty 1

## 2022-08-04 MED ORDER — SCOPOLAMINE 1 MG/3DAYS TD PT72
1.0000 | MEDICATED_PATCH | Freq: Once | TRANSDERMAL | Status: DC
  Administered 2022-08-04: 1.5 mg via TRANSDERMAL
  Filled 2022-08-04: qty 1

## 2022-08-04 MED ORDER — FENTANYL CITRATE (PF) 100 MCG/2ML IJ SOLN
INTRAMUSCULAR | Status: DC | PRN
Start: 2022-08-04 — End: 2022-08-04
  Administered 2022-08-04 (×2): 50 ug via INTRAVENOUS

## 2022-08-04 MED ORDER — PHENYLEPHRINE HCL-NACL 20-0.9 MG/250ML-% IV SOLN
INTRAVENOUS | Status: DC | PRN
  Administered 2022-08-04: 60 ug/min via INTRAVENOUS

## 2022-08-04 MED ORDER — LACTATED RINGERS IV BOLUS
500.0000 mL | Freq: Once | INTRAVENOUS | Status: AC
  Administered 2022-08-04: 500 mL via INTRAVENOUS

## 2022-08-04 MED ORDER — BUPIVACAINE HCL (PF) 0.5 % IJ SOLN
INTRAMUSCULAR | Status: DC | PRN
  Administered 2022-08-04: 10 mL via PERINEURAL

## 2022-08-04 MED ORDER — PHENYLEPHRINE HCL-NACL 20-0.9 MG/250ML-% IV SOLN
INTRAVENOUS | Status: AC
  Filled 2022-08-04: qty 250

## 2022-08-04 MED ORDER — PHENYLEPHRINE HCL (PRESSORS) 10 MG/ML IV SOLN
INTRAVENOUS | Status: DC | PRN
  Administered 2022-08-04: 160 ug via INTRAVENOUS
  Administered 2022-08-04: 240 ug via INTRAVENOUS

## 2022-08-04 MED ORDER — DEXAMETHASONE SODIUM PHOSPHATE 10 MG/ML IJ SOLN
INTRAMUSCULAR | Status: DC | PRN
  Administered 2022-08-04: 8 mg via INTRAVENOUS

## 2022-08-04 MED ORDER — LACTATED RINGERS IV BOLUS
250.0000 mL | Freq: Once | INTRAVENOUS | Status: AC
  Administered 2022-08-04: 250 mL via INTRAVENOUS

## 2022-08-04 MED ORDER — SUGAMMADEX SODIUM 200 MG/2ML IV SOLN
INTRAVENOUS | Status: DC | PRN
  Administered 2022-08-04: 200 mg via INTRAVENOUS

## 2022-08-04 MED ORDER — MIDAZOLAM HCL 5 MG/5ML IJ SOLN
INTRAMUSCULAR | Status: DC | PRN
  Administered 2022-08-04 (×2): 1 mg via INTRAVENOUS

## 2022-08-04 MED ORDER — FENTANYL CITRATE (PF) 100 MCG/2ML IJ SOLN
INTRAMUSCULAR | Status: AC
  Filled 2022-08-04: qty 2

## 2022-08-04 MED ORDER — ONDANSETRON HCL 4 MG/2ML IJ SOLN
INTRAMUSCULAR | Status: DC | PRN
  Administered 2022-08-04: 4 mg via INTRAVENOUS

## 2022-08-04 MED ORDER — BUPIVACAINE LIPOSOME 1.3 % IJ SUSP
INTRAMUSCULAR | Status: DC | PRN
  Administered 2022-08-04: 10 mL via PERINEURAL

## 2022-08-04 MED ORDER — CYCLOBENZAPRINE HCL 10 MG PO TABS: 10.0000 mg | ORAL_TABLET | Freq: Three times a day (TID) | ORAL | 0 refills | Status: AC | PRN

## 2022-08-04 MED ORDER — MIDAZOLAM HCL 2 MG/2ML IJ SOLN
INTRAMUSCULAR | Status: AC
  Filled 2022-08-04: qty 2

## 2022-08-04 MED ORDER — SODIUM CHLORIDE 0.9 % IR SOLN
Status: DC | PRN
  Administered 2022-08-04: 1000 mL

## 2022-08-04 MED ORDER — POVIDONE-IODINE 10 % EX SWAB
2.0000 | Freq: Once | CUTANEOUS | Status: AC
  Administered 2022-08-04: 2 via TOPICAL

## 2022-08-04 MED ORDER — LIDOCAINE HCL (CARDIAC) PF 100 MG/5ML IV SOSY
PREFILLED_SYRINGE | INTRAVENOUS | Status: DC | PRN
  Administered 2022-08-04: 60 mg via INTRAVENOUS

## 2022-08-04 MED ORDER — ONDANSETRON HCL 4 MG/2ML IJ SOLN: 4.0000 mg | Freq: Once | INTRAMUSCULAR | Status: DC | PRN

## 2022-08-04 MED ORDER — FENTANYL CITRATE PF 50 MCG/ML IJ SOSY: 25.0000 ug | PREFILLED_SYRINGE | INTRAMUSCULAR | Status: DC | PRN

## 2022-08-04 MED ORDER — ROCURONIUM BROMIDE 10 MG/ML (PF) SYRINGE
PREFILLED_SYRINGE | INTRAVENOUS | Status: AC
  Filled 2022-08-04: qty 10

## 2022-08-04 MED ORDER — ACETAMINOPHEN 500 MG PO TABS
1000.0000 mg | ORAL_TABLET | Freq: Once | ORAL | Status: AC
  Administered 2022-08-04: 1000 mg via ORAL
  Filled 2022-08-04: qty 2

## 2022-08-04 MED ORDER — STERILE WATER FOR IRRIGATION IR SOLN
Status: DC | PRN
  Administered 2022-08-04: 2000 mL

## 2022-08-04 MED ORDER — TRANEXAMIC ACID-NACL 1000-0.7 MG/100ML-% IV SOLN
1000.0000 mg | INTRAVENOUS | Status: AC
  Administered 2022-08-04: 1000 mg via INTRAVENOUS
  Filled 2022-08-04: qty 100

## 2022-08-04 MED ORDER — LACTATED RINGERS IV BOLUS: 250.0000 mL | Freq: Once | INTRAVENOUS | Status: DC

## 2022-08-04 MED ORDER — HYDROCODONE-ACETAMINOPHEN 10-325 MG PO TABS: 1.0000 | ORAL_TABLET | ORAL | 0 refills | Status: DC | PRN

## 2022-08-04 MED ORDER — PROMETHAZINE HCL 25 MG PO TABS: 25.0000 mg | ORAL_TABLET | Freq: Four times a day (QID) | ORAL | 0 refills | Status: AC | PRN

## 2022-08-04 MED ORDER — ORAL CARE MOUTH RINSE: 15.0000 mL | Freq: Once | OROMUCOSAL | Status: AC

## 2022-08-04 MED ORDER — CEFAZOLIN SODIUM-DEXTROSE 2-4 GM/100ML-% IV SOLN
2.0000 g | INTRAVENOUS | Status: AC
  Administered 2022-08-04: 2 g via INTRAVENOUS
  Filled 2022-08-04: qty 100

## 2022-08-04 MED ORDER — LIDOCAINE HCL (PF) 2 % IJ SOLN
INTRAMUSCULAR | Status: AC
  Filled 2022-08-04: qty 5

## 2022-08-04 MED ORDER — LACTATED RINGERS IV SOLN: INTRAVENOUS | Status: DC

## 2022-08-04 MED ORDER — PROPOFOL 10 MG/ML IV BOLUS
INTRAVENOUS | Status: DC | PRN
  Administered 2022-08-04: 140 mg via INTRAVENOUS

## 2022-08-04 MED ORDER — 0.9 % SODIUM CHLORIDE (POUR BTL) OPTIME
TOPICAL | Status: DC | PRN
  Administered 2022-08-04: 1000 mL

## 2022-08-04 SURGICAL SUPPLY — 64 items
BAG COUNTER SPONGE SURGICOUNT (BAG) IMPLANT
BAG ZIPLOCK 12X15 (MISCELLANEOUS) ×2 IMPLANT
BLADE SAW SGTL 73X25 THK (BLADE) ×2 IMPLANT
CEMENT BONE R 1X40 (Cement) ×2 IMPLANT
CLSR STERI-STRIP ANTIMIC 1/2X4 (GAUZE/BANDAGES/DRESSINGS) ×2 IMPLANT
COOLER ICEMAN CLASSIC (MISCELLANEOUS) ×2 IMPLANT
COVER BACK TABLE 60X90IN (DRAPES) ×2 IMPLANT
COVER MAYO STAND STRL (DRAPES) ×2 IMPLANT
COVER SURGICAL LIGHT HANDLE (MISCELLANEOUS) ×2 IMPLANT
DRAPE POUCH INSTRU U-SHP 10X18 (DRAPES) ×2 IMPLANT
DRAPE SHEET LG 3/4 BI-LAMINATE (DRAPES) ×4 IMPLANT
DRAPE SURG 17X11 SM STRL (DRAPES) ×2 IMPLANT
DRAPE U-SHAPE 47X51 STRL (DRAPES) ×2 IMPLANT
DRSG MEPILEX BORDER 4X8 (GAUZE/BANDAGES/DRESSINGS) ×2 IMPLANT
DURAPREP 26ML APPLICATOR (WOUND CARE) ×2 IMPLANT
ELECT REM PT RETURN 15FT ADLT (MISCELLANEOUS) ×2 IMPLANT
FACESHIELD WRAPAROUND (MASK) ×4 IMPLANT
FACESHIELD WRAPAROUND OR TEAM (MASK) IMPLANT
GLENOID MOD PE 4 PEG SZ 2 (Shoulder) ×1 IMPLANT
GLENOID MOD POST TM SZ2 (Post) ×1 IMPLANT
GLOVE BIO SURGEON STRL SZ 6.5 (GLOVE) ×2 IMPLANT
GLOVE BIO SURGEON STRL SZ7.5 (GLOVE) ×2 IMPLANT
GLOVE BIO SURGEON STRL SZ8 (GLOVE) ×2 IMPLANT
GLOVE BIOGEL PI IND STRL 7.0 (GLOVE) ×1 IMPLANT
GLOVE BIOGEL PI IND STRL 8 (GLOVE) ×2 IMPLANT
GLOVE BIOGEL PI INDICATOR 7.0 (GLOVE) ×1
GLOVE BIOGEL PI INDICATOR 8 (GLOVE) ×2
GOWN STRL REIN XL XLG (GOWN DISPOSABLE) ×4 IMPLANT
HANDPIECE INTERPULSE COAX TIP (DISPOSABLE) ×2
HEAD HUMERAL BIPOLAR 38X19X39 (Miscellaneous) IMPLANT
HEAD HUMERAL COMP STD (Orthopedic Implant) IMPLANT
HOOD PEEL AWAY FLYTE STAYCOOL (MISCELLANEOUS) ×2 IMPLANT
HUMERAL HEAD BIPOLAR 38X19X39 (Miscellaneous) ×2 IMPLANT
HUMERAL HEAD COMP STD (Orthopedic Implant) ×2 IMPLANT
KIT BASIN OR (CUSTOM PROCEDURE TRAY) ×2 IMPLANT
KIT TURNOVER KIT A (KITS) IMPLANT
NS IRRIG 1000ML POUR BTL (IV SOLUTION) ×2 IMPLANT
PACK SHOULDER (CUSTOM PROCEDURE TRAY) ×2 IMPLANT
PAD COLD SHLDR WRAP-ON (PAD) ×2 IMPLANT
PIN STEINMANN THREADED TIP (PIN) ×1 IMPLANT
PIN THREADED REVERSE (PIN) ×1 IMPLANT
PROTECTOR NERVE ULNAR (MISCELLANEOUS) ×2 IMPLANT
RESTRAINT HEAD UNIVERSAL NS (MISCELLANEOUS) ×2 IMPLANT
SET HNDPC FAN SPRY TIP SCT (DISPOSABLE) ×1 IMPLANT
SLING ARM FOAM STRAP LRG (SOFTGOODS) ×1 IMPLANT
SLING ARM IMMOBILIZER LRG (SOFTGOODS) ×2 IMPLANT
SMARTMIX MINI TOWER (MISCELLANEOUS) ×2
SPIKE FLUID TRANSFER (MISCELLANEOUS) IMPLANT
SPONGE T-LAP 4X18 ~~LOC~~+RFID (SPONGE) ×2 IMPLANT
STEM HUMERAL STRL 9MX55MM (Stem) ×1 IMPLANT
SUCTION FRAZIER HANDLE 12FR (TUBING) ×2
SUCTION TUBE FRAZIER 12FR DISP (TUBING) ×1 IMPLANT
SUPPORT WRAP ARM LG (MISCELLANEOUS) ×2 IMPLANT
SUT MAXBRAID (SUTURE) ×1 IMPLANT
SUT MAXBRAID #2 CVD NDL (SUTURE) ×2 IMPLANT
SUT MAXBRAID #5 CCS-NDL 2PK (SUTURE) ×2 IMPLANT
SUT VIC AB 1 CT1 36 (SUTURE) ×2 IMPLANT
SUT VIC AB 2-0 CT1 27 (SUTURE) ×2
SUT VIC AB 2-0 CT1 TAPERPNT 27 (SUTURE) ×1 IMPLANT
SUT VIC AB 3-0 SH 8-18 (SUTURE) ×2 IMPLANT
TOWEL OR 17X26 10 PK STRL BLUE (TOWEL DISPOSABLE) ×2 IMPLANT
TOWEL OR NON WOVEN STRL DISP B (DISPOSABLE) ×2 IMPLANT
TOWER SMARTMIX MINI (MISCELLANEOUS) ×1 IMPLANT
WATER STERILE IRR 1000ML POUR (IV SOLUTION) ×4 IMPLANT

## 2022-08-04 NOTE — Discharge Instructions (Signed)
Diet: As you were doing prior to hospitalization   Shower:  May shower but keep the wounds dry, use an occlusive plastic wrap, NO SOAKING IN TUB.  If the bandage gets wet, change with a clean dry gauze.  If you have a splint on, leave the splint in place and keep the splint dry with a plastic bag.  Dressing:  You may change your dressing 3-5 days after surgery, unless you have a splint.  If you have a splint, then just leave the splint in place and we will change your bandages during your first follow-up appointment.    If you had hand or foot surgery, we will plan to remove your stitches in about 2 weeks in the office.  For all other surgeries, there are sticky tapes (steri-strips) on your wounds and all the stitches are absorbable.  Leave the steri-strips in place when changing your dressings, they will peel off with time, usually 2-3 weeks.  Activity:  Increase activity slowly as tolerated, but follow the weight bearing instructions below.  The rules on driving is that you can not be taking narcotics while you drive, and you must feel in control of the vehicle.    Weight Bearing:  non-weight bearing with left arm  To prevent constipation: you may use a stool softener such as -  Colace (over the counter) 100 mg by mouth twice a day  Drink plenty of fluids (prune juice may be helpful) and high fiber foods Miralax (over the counter) for constipation as needed.    Itching:  If you experience itching with your medications, try taking only a single pain pill, or even half a pain pill at a time.  You may take up to 10 pain pills per day, and you can also use benadryl over the counter for itching or also to help with sleep.   Precautions:  If you experience chest pain or shortness of breath - call 911 immediately for transfer to the hospital emergency department!!  If you develop a fever greater that 101 F, purulent drainage from wound, increased redness or drainage from wound, or calf pain -- Call  the office at [phone]                                                Follow- Up Appointment:  Please call for an appointment to be seen in 2 weeks [place] - [phone]

## 2022-08-04 NOTE — Anesthesia Postprocedure Evaluation (Signed)
Anesthesia Post Note  Patient: Lisa Morris  Procedure(s) Performed: TOTAL SHOULDER ARTHROPLASTY (Left: Shoulder)     Patient location during evaluation: PACU Anesthesia Type: General Level of consciousness: awake and alert Pain management: pain level controlled Vital Signs Assessment: post-procedure vital signs reviewed and stable Respiratory status: spontaneous breathing, nonlabored ventilation, respiratory function stable and patient connected to nasal cannula oxygen Cardiovascular status: blood pressure returned to baseline and stable Postop Assessment: no apparent nausea or vomiting Anesthetic complications: no   No notable events documented.  Last Vitals:  Vitals:   08/04/22 1030 08/04/22 1045  BP: 134/83 (!) 140/84  Pulse: 73 87  Resp: 13 16  Temp: 36.5 C   SpO2: 94% 94%    Last Pain:  Vitals:   08/04/22 1045  TempSrc:   PainSc: 0-No pain                 Santa Lighter

## 2022-08-04 NOTE — Anesthesia Procedure Notes (Signed)
Procedure Name: Intubation Date/Time: 08/04/2022 7:37 AM  Performed by: Sereniti Wan, Forest Gleason, CRNAPre-anesthesia Checklist: Patient identified, Emergency Drugs available, Suction available, Patient being monitored and Timeout performed Patient Re-evaluated:Patient Re-evaluated prior to induction Oxygen Delivery Method: Circle system utilized Preoxygenation: Pre-oxygenation with 100% oxygen Induction Type: IV induction Ventilation: Mask ventilation without difficulty Laryngoscope Size: Mac and 4 Grade View: Grade I Tube type: Oral Tube size: 7.0 mm Number of attempts: 1 Airway Equipment and Method: Stylet Placement Confirmation: ETT inserted through vocal cords under direct vision, positive ETCO2, CO2 detector and breath sounds checked- equal and bilateral Secured at: 21 cm Tube secured with: Tape Dental Injury: Teeth and Oropharynx as per pre-operative assessment

## 2022-08-04 NOTE — H&P (Addendum)
SHOULDER ARTHROPLASTY ADMISSION H&P  Patient ID: Lisa Morris MRN: [id number] DOB/AGE: [birth year] 69 y.o.  Chief Complaint: left shoulder pain.  Planned Procedure Date: 08/04/22 Medical Clearance by Deon Pilling  HPI: Lisa Morris is a 71 y.o. female who presents for evaluation of DJD LEFT SHOULDER. The patient has a history of pain and functional disability in the left shoulder due to arthritis and has failed non-surgical conservative treatments for greater than 12 weeks to include NSAID's and/or analgesics, corticosteriod injections, and activity modification.  Onset of symptoms was gradual, starting 1 years ago with rapidlly worsening course since that time. The patient noted no past surgery on the left shoulder.  Patient currently rates pain at 10 out of 10 with activity. Patient has worsening of pain with activity and weight bearing and pain that interferes with activities of daily living.  Patient has evidence of joint space narrowing by imaging studies.  There is no active infection.  Past Medical History:  Diagnosis Date   Allergy    seasonal    Arthritis    Chicken pox    Complication of anesthesia    nausea   Headache(784.0)    Takes Maxlt   History of hiatal hernia    small   Hyperlipidemia    Primary osteoarthritis of right shoulder 12/19/2012   Seasonal allergies    Past Surgical History:  Procedure Laterality Date   [facility]  1981   KNEE ARTHROSCOPY Left 12/2018   around 2019 or 2020   LIPOSUCTION  2007   Neck   TOTAL SHOULDER ARTHROPLASTY  12/19/2012   Procedure: TOTAL SHOULDER ARTHROPLASTY;  Surgeon: [name], MD;  Location: [facility];  Service: Orthopedics;  Laterality: Right;   No Known Allergies Prior to Admission medications   Medication Sig Start Date End Date Taking? Authorizing Provider  atorvastatin (LIPITOR) 10 MG tablet Take 1 tablet (10 mg total) by mouth daily. NEED PHYSICAL EXAM FOR FURTHER REFILLS 01/21/22  Yes Nafziger, Tommi Rumps, NP   Calcium 500 MG tablet Take 600 mg by mouth daily.   Yes [provider]  cyclobenzaprine (FLEXERIL) 10 MG tablet TAKE 2 TABLETS BY MOUTH IN THE EVENING Patient taking differently: Take 10-20 mg by mouth at bedtime. G 12/24/21  Yes Nafziger, Tommi Rumps, NP  dicyclomine (BENTYL) 10 MG capsule Take 1 capsule (10 mg total) by mouth 4 (four) times daily -  before meals and at bedtime. Patient taking differently: Take 10 mg by mouth at bedtime. 10/27/21  Yes Nandigam, Kavitha V, MD  melatonin 5 MG TABS Take 5 mg by mouth at bedtime.   Yes [provider]  mometasone (NASONEX) 50 MCG/ACT nasal spray Place 2 sprays into the nose at bedtime.   Yes [provider]  pantoprazole (PROTONIX) 40 MG tablet Take 1 tablet (40 mg total) by mouth 2 (two) times daily. 12/04/21  Yes Nandigam, Venia Minks, MD  rizatriptan (MAXALT) 10 MG tablet Take 1 tablet (10 mg total) by mouth as needed. May repeat in 2 hours if needed Patient taking differently: Take 10 mg by mouth daily as needed for migraine. May repeat in 2 hours if needed 10/07/20  Yes Nafziger, Tommi Rumps, NP  topiramate (TOPAMAX) 100 MG tablet Take 1 tablet (100 mg total) by mouth every evening. 08/26/21  Yes Nafziger, Tommi Rumps, NP  venlafaxine XR (EFFEXOR-XR) 75 MG 24 hr capsule Take 3 capsules (225 mg total) by mouth at bedtime. NEED PHYSICAL EXAM FOR FURTHER REFILLS 09/30/21  Yes Dorothyann Peng, NP  Social History   Socioeconomic History   Marital status: Married    Spouse name: Not on file   Number of children: 0   Years of education: Not on file   Highest education level: Not on file  Occupational History   Not on file  Tobacco Use   Smoking status: Never   Smokeless tobacco: Never  Vaping Use   Vaping Use: Never used  Substance and Sexual Activity   Alcohol use: Yes    Alcohol/week: 3.0 standard drinks of alcohol    Types: 3 Glasses of wine per week   Drug use: No   Sexual activity: Not on file  Other Topics Concern   Not on  file  Social History Narrative   Married    Social Determinants of Health   Financial Resource Strain: Low Risk  (08/12/2021)   Overall Financial Resource Strain (CARDIA)    Difficulty of Paying Living Expenses: Not hard at all  Food Insecurity: No Food Insecurity (08/12/2021)   Hunger Vital Sign    Worried About Running Out of Food in the Last Year: Never true    Ran Out of Food in the Last Year: Never true  Transportation Needs: No Transportation Needs (08/12/2021)   PRAPARE - Hydrologist (Medical): No    Lack of Transportation (Non-Medical): No  Physical Activity: Inactive (08/12/2021)   Exercise Vital Sign    Days of Exercise per Week: 0 days    Minutes of Exercise per Session: 0 min  Stress: Stress Concern Present (08/12/2021)   [place]    Feeling of Stress : Rather much  Social Connections: Moderately Integrated (08/12/2021)   Social Connection and Isolation Panel [NHANES]    Frequency of Communication with Friends and Family: Three times a week    Frequency of Social Gatherings with Friends and Family: Three times a week    Attends Religious Services: Never    Active Member of Clubs or Organizations: Yes    Attends Music therapist: More than 4 times per year    Marital Status: Married   Family History  Problem Relation Age of Onset   Arthritis Mother    Hearing loss Mother    Hypertension Mother    Colon polyps Mother    Hearing loss Father    Hyperlipidemia Father    Hypertension Father    Stroke Father    Heart disease Maternal Grandmother    Rheumatic fever Maternal Grandmother    Colon cancer Neg Hx    Esophageal cancer Neg Hx    Rectal cancer Neg Hx    Stomach cancer Neg Hx    BMI: Estimated body mass index is 32.92 kg/m as calculated from the following:   Height as of this encounter: '5\' 2"'$  (1.575 m).   Weight as of this encounter: 81.6 kg.  Lab  Results  Component Value Date   ALBUMIN 4.4 08/29/2019   Diabetes: Patient does not have a diagnosis of diabetes.     Smoking Status:   reports that she has never smoked. She has never used smokeless tobacco.    ROS: Currently denies lightheadedness, dizziness, Fever, chills, CP, SOB.   No personal history of DVT, PE, MI, or CVA. No loose teeth or dentures All other systems have been reviewed and were otherwise currently negative with the exception of those mentioned in the HPI and as above.  Objective: Vitals: Ht: '5\' 2"'$  Wt:  179 lbs Temp: 98.1 BP: 144/94 Pulse: 93 O2 96% on room air.   Physical Exam: General: Alert, NAD.   HEENT: EOMI, Good Neck Extension  Pulm: No increased work of breathing.  Clear B/L A/P w/o crackle or wheeze.  CV: RRR, No m/g/r appreciated  GI: soft, NT, ND Neuro: Neuro without gross focal deficit.  Sensation intact distally Skin: No lesions in the area of chief complaint MSK/Surgical Site: left shoulder pain with range of motion.  Forward flexion/abduction approximately 0-70.  Internal rotation to . Id left buttock External rotation to 10 deg .  No AC pain.  No Biceps pain.  Intact Rotator cuff strength.  NVI distally.  Imaging Review Plain radiographs and CT demonstrate severe degenerative joint disease of the left shoulder.    Assessment: DJD LEFT SHOULDER   Plan: Plan for Procedure(s): TOTAL SHOULDER ARTHROPLASTY  The patient history, physical exam, clinical judgement of the provider and imaging are consistent with end stage degenerative joint disease and  total joint arthroplasty is deemed medically necessary. The treatment options including medical management, injection therapy, and arthroplasty were discussed at length. The risks and benefits of Procedure(s): TOTAL SHOULDER ARTHROPLASTY were presented and reviewed.  The risks of nonoperative treatment, versus surgical intervention including but not limited to continued pain, aseptic loosening,  stiffness, dislocation/subluxation, infection, bleeding, nerve injury, blood clots, cardiopulmonary complications, morbidity, mortality, among others were discussed. The patient verbalizes understanding and wishes to proceed with the plan.  Patient is being admitted for surgery, OT, pain control, prophylactic antibiotics, VTE prophylaxis, progressive ambulation, ADL's and discharge planning.   Dental prophylaxis discussed and recommended for 2 years postoperatively.  The patient does meet the criteria for TXA which will be used perioperatively.   The patient is planning to be discharged home care of family.   Ventura Bruns, PA-C 08/04/2022 6:16 AM

## 2022-08-04 NOTE — Op Note (Signed)
08/04/2022  9:37 AM  PATIENT:  Lisa Morris    PRE-OPERATIVE DIAGNOSIS: Left shoulder primary localized osteoarthritis  POST-OPERATIVE DIAGNOSIS:  Same  PROCEDURE: LEFT total Shoulder Arthroplasty  SURGEON:  [name], MD  PHYSICIAN ASSISTANT: Merlene Pulling, PA-C, present and scrubbed throughout the case, critical for completion in a timely fashion, and for retraction, instrumentation, and closure.  ANESTHESIA:   General with an interscalene block  ESTIMATED BLOOD LOSS: 150 mL  UNIQUE ASPECTS OF THE CASE: Her general anatomy was extremely small.  The glenoid measured to a size 2.  Anterior glenoid had some poor bone quality with a little bit of fragmentation after the reaming, although I had all 4 holes contained, these were barely contained.  The central hole was probably right to the depth of the glenoid, and I did not take that much bone.  I removed just the witness marks from the Colp.  The glenoid implant did feel stable, however I did not really have any room for error.  The rotator cuff was intact, and I got an excellent subscapularis repair, although the #5 sutures were so large in curvature on the needle that it was difficult to pass particularly through the rotator interval.  Nonetheless I was ultimately able to restore appropriate reduction of the cuff.  I did perform a tenotomy technique, with repair.  I used sutures through the bone for the repair.  PREOPERATIVE INDICATIONS:  Lisa Morris is a  71 y.o. female who failed conservative measures and elected for surgical management.    The risks benefits and alternatives were discussed with the patient preoperatively including but not limited to the risks of infection, bleeding, nerve injury, cardiopulmonary complications, the need for revision surgery, dislocation, loosening, incomplete relief of pain, among others, and the patient was willing to proceed.   OPERATIVE IMPLANTS: Biomet size 9 micro press-fit humeral  stem, size 38+19 versa-dial humeral head, set in the be position with increased coverage posteriorly, with a 2 cemented glenoid polyethylene 3 peg implant with a central regenerex noncemented post.   OPERATIVE FINDINGS: Advanced glenohumeral osteoarthritis involving the glenoid and the humeral head with substantial osteophyte formation inferiorly.   OPERATIVE PROCEDURE: The patient was brought to the operating room and placed in the supine position. General anesthesia was administered. IV antibiotics were given.  The upper extremity was prepped and draped in usual sterile fashion. The patient was in a beachchair position with all bony prominences padded.   Time out was performed and a deltopectoral approach was carried out. The biceps tendon was tenodesed to the pectoralis tendon. The subscapularis was released, tagging it with a #2 FiberWire, leaving a cuff of tendon for repair.   The inferior osteophyte was removed, and release of the capsule off of the humeral side was completed. The head was dislocated, and I reamed sequentially. I placed the humeral cutting guide at 30 of retroversion, and then pinned this into place, and made my humeral neck cut. This was at the appropriate level.   I then placed deep retractors and exposed the glenoid. I excised the labrum circumferentially, taking care to protect the axillary nerve inferiorly.   I then placed a guidewire into the center position, controlling appropriate version and inclination. I then reamed over the guidewire with the small reamer, and was satisfied with the preparation. I preserved the subchondral bone in order to maximize the strength and minimize the risk for subsequent subsidence.   I then drilled the central hole for the  regenerex peg, and then placed the guide, and then drilled the 3 peripheral peg holes. I had excellent bony circumferential contact.   I then cleaned the glenoid, irrigated it copiously, and then dried it and cemented  the prosthesis into place. Excellent seating was achieved. I had full exposure. The cement cured while I turned my attention to the humeral side.   I sequentially broached, up to the selected size, with the broach set at 30 of retroversion. I placed 3 # 5 max braid through the bone for subsequent repair.  I then placed the real stem. I trialed with multiple heads, and the above-named component was selected. Increased posterior coverage improved the coverage. The soft tissue tension was appropriate.   I then impacted the real humeral head into place, reduced the head, and irrigated copiously. Excellent stability and range of motion was achieved. I repaired the subscapularis with a total of 4 #5 max braid 1 for the interval, and then the remaining three from the lesser tuberosity which had already been passed.  Excellent repair achieved and I irrigated copiously once more. The subcutaneous tissue was closed with Vicryl including the deltopectoral fascia.   The skin was closed with Steri-Strips and sterile gauze was applied. She had a preoperative nerve block. She tolerated the procedure well and there were no complications.

## 2022-08-04 NOTE — Interval H&P Note (Signed)
History and Physical Interval Note:  08/04/2022 7:18 AM  Lisa Morris  has presented today for surgery, with the diagnosis of DJD LEFT SHOULDER.  The various methods of treatment have been discussed with the patient and family. After consideration of risks, benefits and other options for treatment, the patient has consented to  Procedure(s): TOTAL SHOULDER ARTHROPLASTY (Left) as a surgical intervention.  The patient's history has been reviewed, patient examined, no change in status, stable for surgery.  I have reviewed the patient's chart and labs.  Questions were answered to the patient's satisfaction.     [name]

## 2022-08-04 NOTE — Anesthesia Procedure Notes (Signed)
Anesthesia Regional Block: Interscalene brachial plexus block   Pre-Anesthetic Checklist: , timeout performed,  Correct Patient, Correct Site, Correct Laterality,  Correct Procedure, Correct Position, site marked,  Risks and benefits discussed,  Surgical consent,  Pre-op evaluation,  At surgeon's request and post-op pain management  Laterality: Left  Prep: chloraprep       Needles:  Injection technique: Single-shot  Needle Type: Echogenic Stimulator Needle     Needle Length: 9cm  Needle Gauge: 22     Additional Needles:   Procedures:,,,, ultrasound used (permanent image in chart),,    Narrative:  Start time: 08/04/2022 6:53 AM End time: 08/04/2022 7:03 AM Injection made incrementally with aspirations every 5 mL.  Performed by: Personally  Anesthesiologist: Santa Lighter, MD  Additional Notes: Functioning IV was confirmed and monitors were applied.  A 67m 22ga Arrow echogenic stimulator needle was used. Sterile prep and drape, hand hygiene, and sterile gloves were used.  Negative aspiration and negative test dose prior to incremental administration of local anesthetic. The patient tolerated the procedure well.  Ultrasound guidance: relevent anatomy identified, needle position confirmed, local anesthetic spread visualized around nerve(s), vascular puncture avoided.  Image printed for medical record.

## 2022-08-04 NOTE — Evaluation (Signed)
Occupational Therapy Evaluation Patient Details Name: Lisa Morris MRN: [id number] DOB: 1951-12-05 Today's Date: 08/04/2022   History of Present Illness Patient is a 71 year old female who was admitted for Left shoulder primary localized osteoarthritis. patient underwent a left total shoulder replacement. PMH:R TSA   Clinical Impression   s/p shoulder replacement without functional use of left dominant upper extremity secondary to effects of surgery and interscalene block and shoulder precautions. Therapist provided education and instruction to patient and sister in regards to exercises, precautions, positioning, donning upper extremity clothing and bathing while maintaining shoulder precautions, ice and edema management and donning/doffing sling. Patient and sister verbalized understanding and demonstrated as needed. Patient needed assistance to donn shirt, underwear, pants, socks and shoes and provided with instruction on compensatory strategies to perform ADLs. Patient to follow up with MD for further therapy needs.        Recommendations for follow up therapy are one component of a multi-disciplinary discharge planning process, led by the attending physician.  Recommendations may be updated based on patient status, additional functional criteria and insurance authorization.   Follow Up Recommendations  Follow physician's recommendations for discharge plan and follow up therapies    Assistance Recommended at Discharge Frequent or constant Supervision/Assistance  Patient can return home with the following A little help with walking and/or transfers;A little help with bathing/dressing/bathroom;Assistance with cooking/housework;Direct supervision/assist for financial management;Assist for transportation;Help with stairs or ramp for entrance    Functional Status Assessment  Patient has had a recent decline in their functional status and demonstrates the ability to make significant  improvements in function in a reasonable and predictable amount of time.  Equipment Recommendations       Recommendations for Other Services       Precautions / Restrictions Precautions Precautions: Shoulder Type of Shoulder Precautions: NO ROM shoulder, ok for hand wrist and elbow Shoulder Interventions: Shoulder sling/immobilizer;At all times;Off for dressing/bathing/exercises Precaution Booklet Issued: Yes (comment) (handout) Restrictions Weight Bearing Restrictions: Yes LUE Weight Bearing: Non weight bearing      Mobility Bed Mobility                    Transfers                          Balance                                           ADL either performed or assessed with clinical judgement   ADL                                               Vision Patient Visual Report: No change from baseline       Perception     Praxis      Pertinent Vitals/Pain Pain Assessment Pain Assessment: No/denies pain (block in place)     Hand Dominance Left   Extremity/Trunk Assessment Upper Extremity Assessment Upper Extremity Assessment: LUE deficits/detail LUE Deficits / Details: shoulder replacement surgery pacu. tingling in digits and able to minimally move digits LUE Coordination: decreased fine motor;decreased gross motor   Lower Extremity Assessment Lower Extremity Assessment: Overall WFL for tasks assessed   Cervical / Trunk Assessment Cervical /  Trunk Assessment: Normal   Communication Communication Communication: No difficulties   Cognition Arousal/Alertness: Awake/alert Behavior During Therapy: WFL for tasks assessed/performed Overall Cognitive Status: Within Functional Limits for tasks assessed                                 General Comments: patient reported feeling a little fuzzy but ok. sister was present     General Comments       Exercises     Shoulder Instructions  Shoulder Instructions Donning/doffing shirt without moving shoulder: Moderate assistance;Caregiver independent with task Method for sponge bathing under operated UE: Caregiver independent with task Donning/doffing sling/immobilizer: Caregiver independent with task Correct positioning of sling/immobilizer: Caregiver independent with task ROM for elbow, wrist and digits of operated UE: Caregiver independent with task Sling wearing schedule (on at all times/off for ADL's): Caregiver independent with task Proper positioning of operated UE when showering: Caregiver independent with task Positioning of UE while sleeping: Caregiver independent with task    Home Living Family/patient expects to be discharged to:: Private residence Living Arrangements: Spouse/significant other Available Help at Discharge: Family;Available 24 hours/day                                    Prior Functioning/Environment Prior Level of Function : Independent/Modified Independent                        OT Problem List:        OT Treatment/Interventions:      OT Goals(Current goals can be found in the care plan section) Acute Rehab OT Goals OT Goal Formulation: All assessment and education complete, DC therapy  OT Frequency:      Co-evaluation              AM-PAC OT "6 Clicks" Daily Activity     Outcome Measure Help from another person eating meals?: None Help from another person taking care of personal grooming?: None Help from another person toileting, which includes using toliet, bedpan, or urinal?: A Little Help from another person bathing (including washing, rinsing, drying)?: A Little Help from another person to put on and taking off regular upper body clothing?: A Little Help from another person to put on and taking off regular lower body clothing?: A Little 6 Click Score: 20   End of Session Equipment Utilized During Treatment: Other (comment) (sling) Nurse  Communication: Mobility status  Activity Tolerance: Patient tolerated treatment well Patient left: in chair;with family/visitor present  OT Visit Diagnosis: Pain                Time: 1657-9038 OT Time Calculation (min): 24 min Charges:  OT General Charges $OT Visit: 1 Visit OT Evaluation $OT Eval Low Complexity: 1 Low OT Treatments $Self Care/Home Management : 8-22 mins  Lisa Morris OTR/L, MS Acute Rehabilitation Department Office# [phone] Pager# [phone]   Lisa Morris 08/04/2022, 12:10 PM

## 2022-08-04 NOTE — Transfer of Care (Signed)
Immediate Anesthesia Transfer of Care Note  Patient: Lisa Morris  Procedure(s) Performed: TOTAL SHOULDER ARTHROPLASTY (Left: Shoulder)  Patient Location: PACU  Anesthesia Type:General  Level of Consciousness: awake  Airway & Oxygen Therapy: Patient Spontanous Breathing  Post-op Assessment: Report given to RN  Post vital signs: stable  Last Vitals:  Vitals Value Taken Time  BP 144/95 08/04/22 0954  Temp    Pulse 84 08/04/22 0954  Resp 16 08/04/22 0954  SpO2 98 % 08/04/22 0954  Vitals shown include unvalidated device data.  Last Pain:  Vitals:   08/04/22 0601  TempSrc:   PainSc: 4       Patients Stated Pain Goal: 3 (46/65/99 3570)  Complications: No notable events documented.

## 2022-08-05 ENCOUNTER — Encounter (HOSPITAL_COMMUNITY): Payer: Self-pay | Admitting: Orthopedic Surgery

## 2022-08-17 DIAGNOSIS — M19012 Primary osteoarthritis, left shoulder: Secondary | ICD-10-CM | POA: Diagnosis not present

## 2022-09-17 DIAGNOSIS — R7309 Other abnormal glucose: Secondary | ICD-10-CM | POA: Diagnosis not present

## 2022-09-17 DIAGNOSIS — E7800 Pure hypercholesterolemia, unspecified: Secondary | ICD-10-CM | POA: Diagnosis not present

## 2022-09-18 DIAGNOSIS — M1712 Unilateral primary osteoarthritis, left knee: Secondary | ICD-10-CM | POA: Diagnosis not present

## 2022-09-18 DIAGNOSIS — M19012 Primary osteoarthritis, left shoulder: Secondary | ICD-10-CM | POA: Diagnosis not present

## 2022-09-19 ENCOUNTER — Other Ambulatory Visit: Payer: Self-pay | Admitting: Gastroenterology

## 2022-09-19 DIAGNOSIS — K589 Irritable bowel syndrome without diarrhea: Secondary | ICD-10-CM

## 2022-09-22 DIAGNOSIS — M25612 Stiffness of left shoulder, not elsewhere classified: Secondary | ICD-10-CM | POA: Diagnosis not present

## 2022-09-22 DIAGNOSIS — M25512 Pain in left shoulder: Secondary | ICD-10-CM | POA: Diagnosis not present

## 2022-09-22 DIAGNOSIS — Z471 Aftercare following joint replacement surgery: Secondary | ICD-10-CM | POA: Diagnosis not present

## 2022-09-22 DIAGNOSIS — R293 Abnormal posture: Secondary | ICD-10-CM | POA: Diagnosis not present

## 2022-09-24 DIAGNOSIS — M25512 Pain in left shoulder: Secondary | ICD-10-CM | POA: Diagnosis not present

## 2022-09-24 DIAGNOSIS — R293 Abnormal posture: Secondary | ICD-10-CM | POA: Diagnosis not present

## 2022-09-24 DIAGNOSIS — Z471 Aftercare following joint replacement surgery: Secondary | ICD-10-CM | POA: Diagnosis not present

## 2022-09-24 DIAGNOSIS — G43101 Migraine with aura, not intractable, with status migrainosus: Secondary | ICD-10-CM | POA: Diagnosis not present

## 2022-09-24 DIAGNOSIS — Z0000 Encounter for general adult medical examination without abnormal findings: Secondary | ICD-10-CM | POA: Diagnosis not present

## 2022-09-24 DIAGNOSIS — M25612 Stiffness of left shoulder, not elsewhere classified: Secondary | ICD-10-CM | POA: Diagnosis not present

## 2022-09-24 DIAGNOSIS — K209 Esophagitis, unspecified without bleeding: Secondary | ICD-10-CM | POA: Diagnosis not present

## 2022-09-24 DIAGNOSIS — Z23 Encounter for immunization: Secondary | ICD-10-CM | POA: Diagnosis not present

## 2022-09-28 DIAGNOSIS — M25612 Stiffness of left shoulder, not elsewhere classified: Secondary | ICD-10-CM | POA: Diagnosis not present

## 2022-09-28 DIAGNOSIS — M25512 Pain in left shoulder: Secondary | ICD-10-CM | POA: Diagnosis not present

## 2022-09-28 DIAGNOSIS — R293 Abnormal posture: Secondary | ICD-10-CM | POA: Diagnosis not present

## 2022-09-28 DIAGNOSIS — Z471 Aftercare following joint replacement surgery: Secondary | ICD-10-CM | POA: Diagnosis not present

## 2022-09-30 DIAGNOSIS — R293 Abnormal posture: Secondary | ICD-10-CM | POA: Diagnosis not present

## 2022-09-30 DIAGNOSIS — Z471 Aftercare following joint replacement surgery: Secondary | ICD-10-CM | POA: Diagnosis not present

## 2022-09-30 DIAGNOSIS — M25512 Pain in left shoulder: Secondary | ICD-10-CM | POA: Diagnosis not present

## 2022-09-30 DIAGNOSIS — M25612 Stiffness of left shoulder, not elsewhere classified: Secondary | ICD-10-CM | POA: Diagnosis not present

## 2022-10-02 DIAGNOSIS — M25612 Stiffness of left shoulder, not elsewhere classified: Secondary | ICD-10-CM | POA: Diagnosis not present

## 2022-10-02 DIAGNOSIS — R293 Abnormal posture: Secondary | ICD-10-CM | POA: Diagnosis not present

## 2022-10-02 DIAGNOSIS — M25512 Pain in left shoulder: Secondary | ICD-10-CM | POA: Diagnosis not present

## 2022-10-02 DIAGNOSIS — Z471 Aftercare following joint replacement surgery: Secondary | ICD-10-CM | POA: Diagnosis not present

## 2022-10-05 DIAGNOSIS — M25612 Stiffness of left shoulder, not elsewhere classified: Secondary | ICD-10-CM | POA: Diagnosis not present

## 2022-10-05 DIAGNOSIS — Z471 Aftercare following joint replacement surgery: Secondary | ICD-10-CM | POA: Diagnosis not present

## 2022-10-05 DIAGNOSIS — R293 Abnormal posture: Secondary | ICD-10-CM | POA: Diagnosis not present

## 2022-10-05 DIAGNOSIS — M25512 Pain in left shoulder: Secondary | ICD-10-CM | POA: Diagnosis not present

## 2022-10-09 DIAGNOSIS — R293 Abnormal posture: Secondary | ICD-10-CM | POA: Diagnosis not present

## 2022-10-09 DIAGNOSIS — M25512 Pain in left shoulder: Secondary | ICD-10-CM | POA: Diagnosis not present

## 2022-10-09 DIAGNOSIS — M25612 Stiffness of left shoulder, not elsewhere classified: Secondary | ICD-10-CM | POA: Diagnosis not present

## 2022-10-09 DIAGNOSIS — Z471 Aftercare following joint replacement surgery: Secondary | ICD-10-CM | POA: Diagnosis not present

## 2022-10-12 DIAGNOSIS — M25512 Pain in left shoulder: Secondary | ICD-10-CM | POA: Diagnosis not present

## 2022-10-12 DIAGNOSIS — M25612 Stiffness of left shoulder, not elsewhere classified: Secondary | ICD-10-CM | POA: Diagnosis not present

## 2022-10-12 DIAGNOSIS — Z471 Aftercare following joint replacement surgery: Secondary | ICD-10-CM | POA: Diagnosis not present

## 2022-10-12 DIAGNOSIS — R293 Abnormal posture: Secondary | ICD-10-CM | POA: Diagnosis not present

## 2022-10-16 DIAGNOSIS — M19012 Primary osteoarthritis, left shoulder: Secondary | ICD-10-CM | POA: Diagnosis not present

## 2022-10-19 DIAGNOSIS — M25512 Pain in left shoulder: Secondary | ICD-10-CM | POA: Diagnosis not present

## 2022-10-19 DIAGNOSIS — Z471 Aftercare following joint replacement surgery: Secondary | ICD-10-CM | POA: Diagnosis not present

## 2022-10-19 DIAGNOSIS — M25612 Stiffness of left shoulder, not elsewhere classified: Secondary | ICD-10-CM | POA: Diagnosis not present

## 2022-10-19 DIAGNOSIS — R293 Abnormal posture: Secondary | ICD-10-CM | POA: Diagnosis not present

## 2022-10-22 IMAGING — CT CT SHOULDER*L* W/O CM
1 of 2 series · 9 of 14 positions shown, 12 images · non-contrast
Comparison: None Available.

CLINICAL DATA: Preoperative assessment prior to arthroplasty.

EXAM:
CT OF THE UPPER LEFT EXTREMITY WITHOUT CONTRAST
TECHNIQUE: Multidetector CT imaging of the upper left extremity was performed
according to the standard protocol.
RADIATION DOSE REDUCTION: This exam was performed according to the
departmental dose-optimization program which includes automated
exposure control, adjustment of the mA and/or kV according to
patient size and/or use of iterative reconstruction technique.

[Series 5: thin soft · axial · 0.60mm/px · z∈[-589,-429]mm · 9 of 335 slices shown, 12 images]
[im 34/335  soft-tissue]
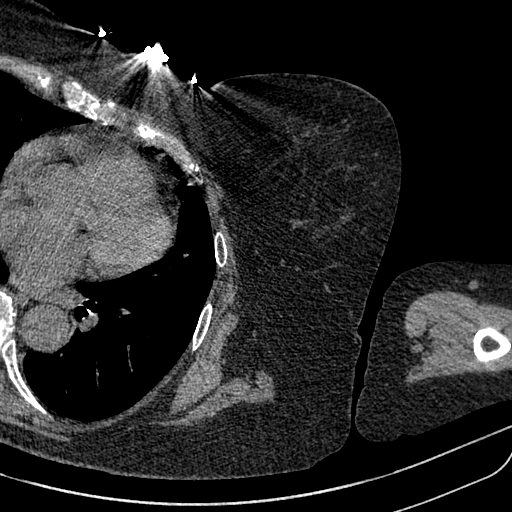
[im 34/335  bone]
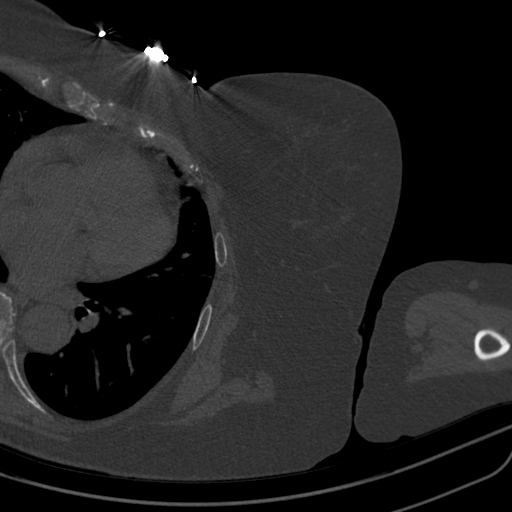
[im 67/335  bone]
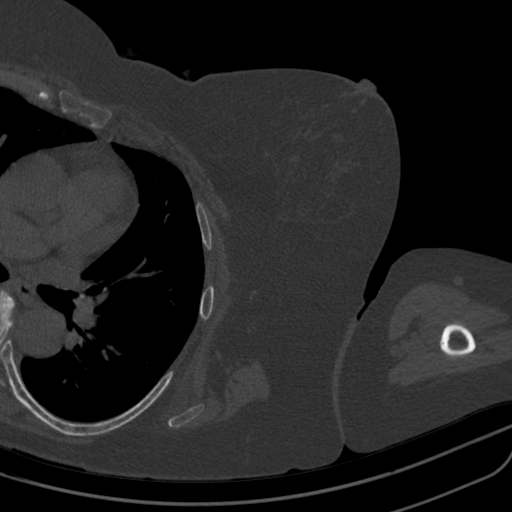
[im 101/335  bone]
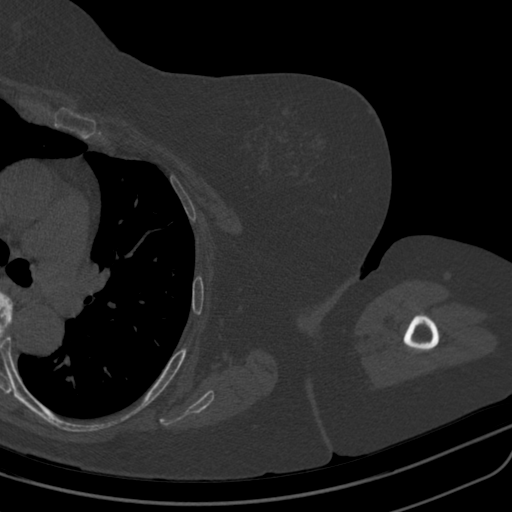
[im 134/335  bone]
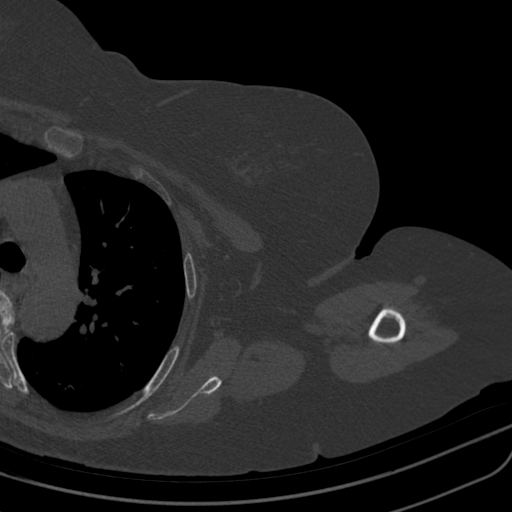
[im 168/335  soft-tissue]
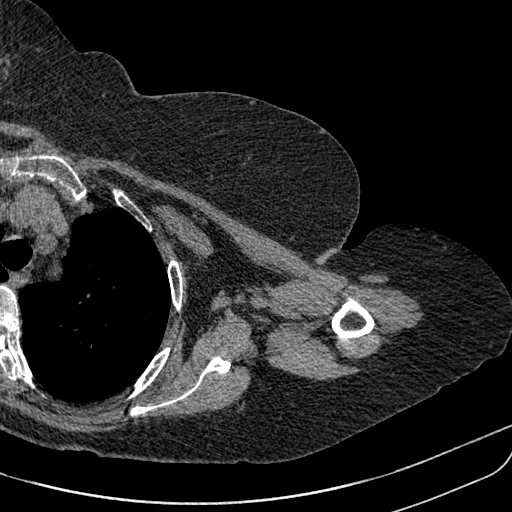
[im 168/335  bone]
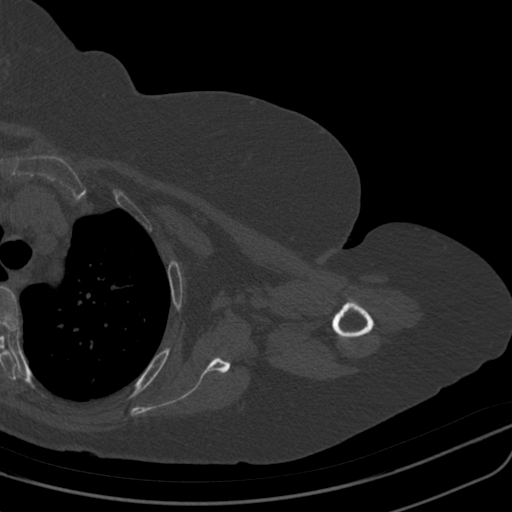
[im 201/335  bone]
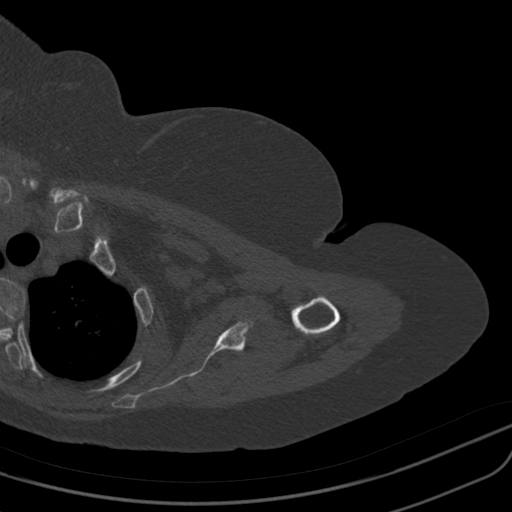
[im 234/335  bone]
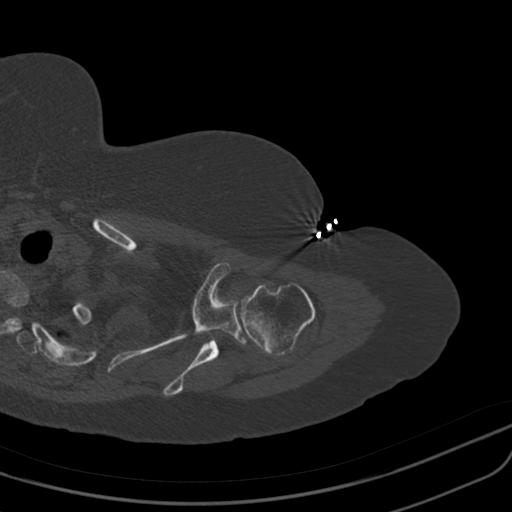
[im 268/335  bone]
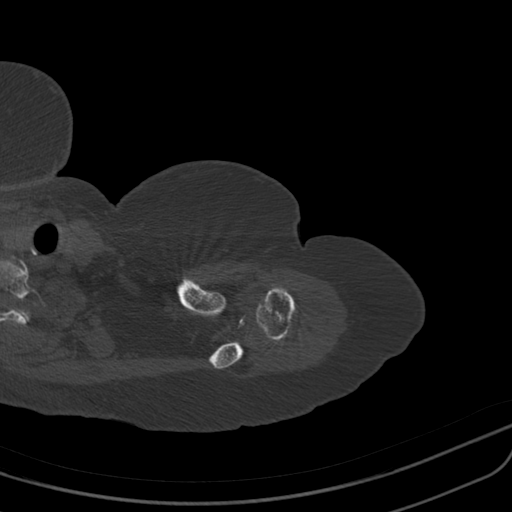
[im 301/335  soft-tissue]
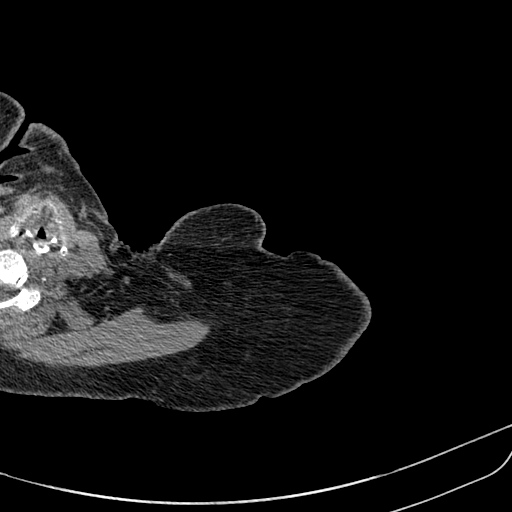
[im 301/335  bone]
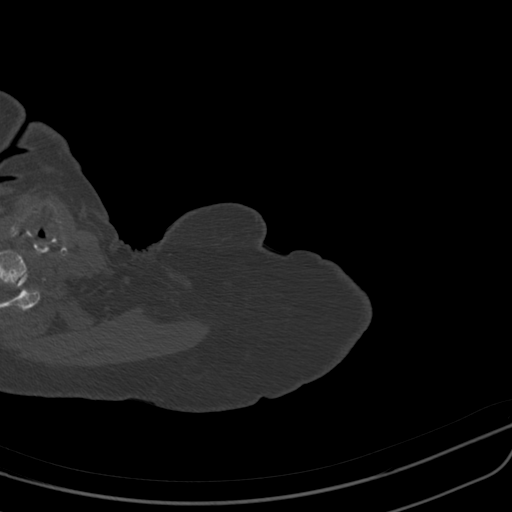

[9 of 14 positions shown; findings below may reference images not displayed]

FINDINGS: Bones/Joint/Cartilage

No fracture or dislocation. Normal alignment. No joint effusion.
Subchondral serpiginous sclerotic signal abnormality concerning for
avascular necrosis without articular surface collapse.

Severe osteoarthritis of the glenohumeral joint.

Mild arthropathy of the acromioclavicular joint.

Ligaments

Ligaments are suboptimally evaluated by CT.

Muscles and Tendons
Muscles are normal.  No muscle atrophy.

Soft tissue
No fluid collection or hematoma. No soft tissue mass. Visualized
portions of the lung are clear.
IMPRESSION: 1. Severe osteoarthritis of the left glenohumeral joint.

## 2022-11-27 DIAGNOSIS — M19012 Primary osteoarthritis, left shoulder: Secondary | ICD-10-CM | POA: Diagnosis not present

## 2023-01-05 ENCOUNTER — Ambulatory Visit: Payer: Medicare Other | Admitting: Gastroenterology

## 2023-01-05 ENCOUNTER — Encounter: Payer: Self-pay | Admitting: Gastroenterology

## 2023-01-05 ENCOUNTER — Other Ambulatory Visit (HOSPITAL_BASED_OUTPATIENT_CLINIC_OR_DEPARTMENT_OTHER): Payer: Self-pay

## 2023-01-05 VITALS — BP 132/86 | HR 74 | Ht 62.0 in | Wt 185.0 lb

## 2023-01-05 DIAGNOSIS — K222 Esophageal obstruction: Secondary | ICD-10-CM | POA: Diagnosis not present

## 2023-01-05 DIAGNOSIS — K2100 Gastro-esophageal reflux disease with esophagitis, without bleeding: Secondary | ICD-10-CM | POA: Diagnosis not present

## 2023-01-05 MED ORDER — PANTOPRAZOLE SODIUM 40 MG PO TBEC
40.0000 mg | DELAYED_RELEASE_TABLET | Freq: Two times a day (BID) | ORAL | 3 refills | Status: DC
  Filled 2023-01-05 – 2023-01-22 (×2): qty 90, 45d supply, fill #0

## 2023-01-05 NOTE — Patient Instructions (Signed)
We have sent the following medications to your pharmacy for you to pick up at your convenience: pantoprazole.   Patient advised to avoid spicy, acidic, citrus, chocolate, mints, fruit and fruit juices.  Limit the intake of caffeine, alcohol and Soda.  Don't exercise too soon after eating.  Don't lie down within 3-4 hours of eating.  Elevate the head of your bed.  The [facility] providers would like to encourage you to use [facility] to communicate with providers for non-urgent requests or questions.  Due to long hold times on the telephone, sending your provider a message by [facility] may be a faster and more efficient way to get a response.  Please allow 48 business hours for a response.  Please remember that this is for non-urgent requests.

## 2023-01-05 NOTE — Progress Notes (Signed)
Lisa Morris    [id number]    Dec 17, 1951  Primary Care Physician:[facility]  Referring Physician: [facility] [address] [place],  [place] [address]   Chief complaint:  GERD   HPI:  72 yr very pleasant female here for follow-up visit for erosive esophagitis and GERD.   She does not have any typical symptoms of GERD, only occasional heartburn.  Since EGD she has been avoiding caffeinated drinks.  She is not taking Protonix daily.  Was on pantoprazole twice daily after EGD, took it for few months and then stopped Denies any episodes of food impaction or difficulty swallowing.  Overall symptoms are stable  EGD 07/09/22 - Benign-appearing esophageal stenosis. Dilated. - Small hiatal hernia. - Normal stomach. - Congested duodenal mucosa. Biopsied.   She is using dicyclomine as needed with improvement of abdominal cramping and discomfort   Colonoscopy January 17, 2021: Hemorrhoids otherwise normal exam.   EGD 12/04/21 - LA Grade C reflux esophagitis with bleeding. Biopsied. - Small hiatal hernia. - Benign-appearing esophageal stenosis. - Normal stomach. - Normal examined duodenum.   Outpatient Encounter Medications as of 01/05/2023  Medication Sig   atorvastatin (LIPITOR) 10 MG tablet Take 1 tablet (10 mg total) by mouth daily. NEED PHYSICAL EXAM FOR FURTHER REFILLS   Calcium 500 MG tablet Take 600 mg by mouth daily.   cyclobenzaprine (FLEXERIL) 10 MG tablet Take 1 tablet (10 mg total) by mouth 3 (three) times daily as needed for muscle spasms.   dicyclomine (BENTYL) 10 MG capsule Take 1 capsule (10 mg total) by mouth at bedtime.   HYDROcodone-acetaminophen (NORCO) 10-325 MG tablet Take 1 tablet by mouth every 4 (four) hours as needed.   melatonin 5 MG TABS Take 5 mg by mouth at bedtime.   mometasone (NASONEX) 50 MCG/ACT nasal spray Place 2 sprays into the nose at bedtime.   pantoprazole (PROTONIX) 40 MG tablet Take 1  tablet (40 mg total) by mouth 2 (two) times daily.   rizatriptan (MAXALT) 10 MG tablet Take 1 tablet (10 mg total) by mouth as needed. May repeat in 2 hours if needed (Patient taking differently: Take 10 mg by mouth daily as needed for migraine. May repeat in 2 hours if needed)   topiramate (TOPAMAX) 100 MG tablet Take 1 tablet (100 mg total) by mouth every evening.   venlafaxine XR (EFFEXOR-XR) 75 MG 24 hr capsule Take 3 capsules (225 mg total) by mouth at bedtime. NEED PHYSICAL EXAM FOR FURTHER REFILLS   cyclobenzaprine (FLEXERIL) 10 MG tablet TAKE 2 TABLETS BY MOUTH IN THE EVENING (Patient not taking: Reported on 01/05/2023)   promethazine (PHENERGAN) 25 MG tablet Take 1 tablet (25 mg total) by mouth every 6 (six) hours as needed for nausea or vomiting. (Patient not taking: Reported on 01/05/2023)   No facility-administered encounter medications on file as of 01/05/2023.    Allergies as of 01/05/2023   (No Known Allergies)    Past Medical History:  Diagnosis Date   Allergy    seasonal    Arthritis    Chicken pox    Complication of anesthesia    nausea   Headache(784.0)    Takes Maxlt   History of hiatal hernia    small   Hyperlipidemia    Primary osteoarthritis of right shoulder 12/19/2012   Seasonal allergies     Past Surgical History:  Procedure Laterality Date   [facility]  1981   KNEE  ARTHROSCOPY Left 12/2018   around 2019 or 2020   LIPOSUCTION  2007   Neck   TOTAL SHOULDER ARTHROPLASTY  12/19/2012   Procedure: TOTAL SHOULDER ARTHROPLASTY;  Surgeon: [name], MD;  Location: [facility];  Service: Orthopedics;  Laterality: Right;   TOTAL SHOULDER ARTHROPLASTY Left 08/04/2022   Procedure: TOTAL SHOULDER ARTHROPLASTY;  Surgeon: Marchia Bond, MD;  Location: [facility];  Service: Orthopedics;  Laterality: Left;    Family History  Problem Relation Age of Onset   Arthritis Mother    Hearing loss Mother    Hypertension Mother    Colon polyps Mother    Hearing loss Father     Hyperlipidemia Father    Hypertension Father    Stroke Father    Heart disease Maternal Grandmother    Rheumatic fever Maternal Grandmother    Colon cancer Neg Hx    Esophageal cancer Neg Hx    Rectal cancer Neg Hx    Stomach cancer Neg Hx     Social History   Socioeconomic History   Marital status: Married    Spouse name: Not on file   Number of children: 0   Years of education: Not on file   Highest education level: Not on file  Occupational History   Not on file  Tobacco Use   Smoking status: Never   Smokeless tobacco: Never  Vaping Use   Vaping Use: Never used  Substance and Sexual Activity   Alcohol use: Yes    Alcohol/week: 3.0 standard drinks of alcohol    Types: 3 Glasses of wine per week   Drug use: No   Sexual activity: Not on file  Other Topics Concern   Not on file  Social History Narrative   Married    Social Determinants of Health   Financial Resource Strain: Low Risk  (08/12/2021)   Overall Financial Resource Strain (CARDIA)    Difficulty of Paying Living Expenses: Not hard at all  Food Insecurity: No Food Insecurity (08/12/2021)   Hunger Vital Sign    Worried About Running Out of Food in the Last Year: Never true    Ran Out of Food in the Last Year: Never true  Transportation Needs: No Transportation Needs (08/12/2021)   PRAPARE - Hydrologist (Medical): No    Lack of Transportation (Non-Medical): No  Physical Activity: Inactive (08/12/2021)   Exercise Vital Sign    Days of Exercise per Week: 0 days    Minutes of Exercise per Session: 0 min  Stress: Stress Concern Present (08/12/2021)   [place]    Feeling of Stress : Rather much  Social Connections: Moderately Integrated (08/12/2021)   Social Connection and Isolation Panel [NHANES]    Frequency of Communication with Friends and Family: Three times a week    Frequency of Social Gatherings with Friends  and Family: Three times a week    Attends Religious Services: Never    Active Member of Clubs or Organizations: Yes    Attends Archivist Meetings: More than 4 times per year    Marital Status: Married  Human resources officer Violence: Not At Risk (08/12/2021)   Humiliation, Afraid, Rape, and Kick questionnaire    Fear of Current or Ex-Partner: No    Emotionally Abused: No    Physically Abused: No    Sexually Abused: No      Review of systems: All other review of systems negative except  as mentioned in the HPI.   Physical Exam: Vitals:   01/05/23 1042  BP: 132/86  Pulse: 74   Body mass index is 33.84 kg/m. Gen:      No acute distress Neuro: alert and oriented x 3 Psych: normal mood and affect  Data Reviewed:  Reviewed labs, radiology imaging, old records and pertinent past GI work up   Assessment and Plan/Recommendations:  72 year old very pleasant female with GERD and erosive esophagitis  Intermittent breakthrough GERD symptoms Use pantoprazole 20 mg daily Antireflux measures Avoid or limit use of NSAIDs   Return in 6 months  This visit required >30 minutes of patient care (this includes precharting, chart review, review of results, face-to-face time used for counseling as well as treatment plan and follow-up. The patient was provided an opportunity to ask questions and all were answered. The patient agreed with the plan and demonstrated an understanding of the instructions.  Damaris Hippo , MD    CC: [facility] *

## 2023-01-18 ENCOUNTER — Other Ambulatory Visit (HOSPITAL_BASED_OUTPATIENT_CLINIC_OR_DEPARTMENT_OTHER): Payer: Self-pay

## 2023-01-22 ENCOUNTER — Other Ambulatory Visit (HOSPITAL_BASED_OUTPATIENT_CLINIC_OR_DEPARTMENT_OTHER): Payer: Self-pay

## 2023-02-05 DIAGNOSIS — M19012 Primary osteoarthritis, left shoulder: Secondary | ICD-10-CM | POA: Diagnosis not present

## 2023-02-05 DIAGNOSIS — M19041 Primary osteoarthritis, right hand: Secondary | ICD-10-CM | POA: Diagnosis not present

## 2023-03-23 DIAGNOSIS — H1030 Unspecified acute conjunctivitis, unspecified eye: Secondary | ICD-10-CM | POA: Diagnosis not present

## 2023-04-01 ENCOUNTER — Other Ambulatory Visit: Payer: Self-pay

## 2023-04-01 DIAGNOSIS — Z1231 Encounter for screening mammogram for malignant neoplasm of breast: Secondary | ICD-10-CM

## 2023-05-03 ENCOUNTER — Ambulatory Visit
Admission: RE | Admit: 2023-05-03 | Discharge: 2023-05-03 | Disposition: A | Discharge status: Home / Self Care | Payer: Medicare Other | Source: Ambulatory Visit

## 2023-05-03 DIAGNOSIS — Z1231 Encounter for screening mammogram for malignant neoplasm of breast: Secondary | ICD-10-CM | POA: Diagnosis not present

## 2023-05-14 DIAGNOSIS — M25562 Pain in left knee: Secondary | ICD-10-CM | POA: Diagnosis not present

## 2023-08-13 DIAGNOSIS — T8484XA Pain due to internal orthopedic prosthetic devices, implants and grafts, initial encounter: Secondary | ICD-10-CM | POA: Diagnosis not present

## 2023-08-13 DIAGNOSIS — M1712 Unilateral primary osteoarthritis, left knee: Secondary | ICD-10-CM | POA: Diagnosis not present

## 2023-09-02 ENCOUNTER — Ambulatory Visit (INDEPENDENT_AMBULATORY_CARE_PROVIDER_SITE_OTHER): Payer: Medicare Other | Admitting: Nurse Practitioner

## 2023-09-02 ENCOUNTER — Encounter: Payer: Self-pay | Admitting: Nurse Practitioner

## 2023-09-02 VITALS — BP 142/84 | HR 75 | Temp 98.2°F | Ht 61.0 in | Wt 180.0 lb

## 2023-09-02 DIAGNOSIS — I10 Essential (primary) hypertension: Secondary | ICD-10-CM | POA: Diagnosis not present

## 2023-09-02 DIAGNOSIS — Z6834 Body mass index (BMI) 34.0-34.9, adult: Secondary | ICD-10-CM

## 2023-09-02 DIAGNOSIS — E669 Obesity, unspecified: Secondary | ICD-10-CM

## 2023-09-02 DIAGNOSIS — Z0289 Encounter for other administrative examinations: Secondary | ICD-10-CM

## 2023-09-02 NOTE — Progress Notes (Signed)
Office: [phone]  /  Fax: [phone]   Initial Visit  Lisa Morris was seen in clinic today to evaluate for obesity. She is interested in losing weight to improve overall health and reduce the risk of weight related complications. She presents today to review program treatment options, initial physical assessment, and evaluation.     She was referred by: Friend or Family  When asked what else they would like to accomplish? She states: Improve existing medical conditions, Improve quality of life, and Lose a target amount of weight : 20-30 lbs  Would like to lose weight to help with knee pain  Weight history:  She started gaining weight in the 90s.  Her weight has fluctuated over the years.   When asked how has your weight affected you? She states: Contributed to orthopedic problems or mobility issues, Having fatigue, and Having poor endurance  Some associated conditions: migraines, OA of knee, OA of right shoulder, HLD  Contributing factors: Family history, Medications, and Menopause  Weight promoting medications identified: Contraceptives or hormonal therapy  Current nutrition plan: None  Current level of physical activity: None  Current or previous pharmacotherapy: None  Response to medication: Never tried medications   Past medical history includes:   Past Medical History:  Diagnosis Date   Allergy    seasonal    Arthritis    Chicken pox    Complication of anesthesia    nausea   Headache(784.0)    Takes Maxlt   History of hiatal hernia    small   Hyperlipidemia    Primary osteoarthritis of right shoulder 12/19/2012   Seasonal allergies      Objective:   BP (!) 142/84   Pulse 75   Temp 98.2 F (36.8 C)   Ht 5\' 1"  (1.549 m)   Wt 180 lb (81.6 kg)   SpO2 98%   BMI 34.01 kg/m  She was weighed on the bioimpedance scale: Body mass index is 34.01 kg/m.  Peak Weight:185 lbs , Body Fat%:44.9%, Visceral Fat Rating:14, Weight trend over the last 12  months: Unchanged  General:  Alert, oriented and cooperative. Patient is in no acute distress.  Respiratory: Normal respiratory effort, no problems with respiration noted   Gait: able to ambulate independently  Mental Status: Normal mood and affect. Normal behavior. Normal judgment and thought content.   DIAGNOSTIC DATA REVIEWED:  BMET    Component Value Date/Time   NA 141 07/23/2022 0857   K 4.2 07/23/2022 0857   CL 110 07/23/2022 0857   CO2 25 07/23/2022 0857   GLUCOSE 91 07/23/2022 0857   BUN 17 07/23/2022 0857   CREATININE 0.82 07/23/2022 0857   CALCIUM 9.3 07/23/2022 0857   GFRNONAA >60 07/23/2022 0857   GFRAA >90 12/20/2012 0710   Lab Results  Component Value Date   HGBA1C 5.4 04/14/2018   No results found for: "INSULIN" CBC    Component Value Date/Time   WBC 5.9 07/23/2022 0857   RBC 4.78 07/23/2022 0857   HGB 14.0 07/23/2022 0857   HCT 42.8 07/23/2022 0857   PLT 188 07/23/2022 0857   MCV 89.5 07/23/2022 0857   MCH 29.3 07/23/2022 0857   MCHC 32.7 07/23/2022 0857   RDW 14.6 07/23/2022 0857   Iron/TIBC/Ferritin/ %Sat No results found for: "IRON", "TIBC", "FERRITIN", "IRONPCTSAT" Lipid Panel     Component Value Date/Time   CHOL 172 08/29/2019 0839   TRIG 92.0 08/29/2019 0839   HDL 81.60 08/29/2019 0839   CHOLHDL 2 08/29/2019  0839   VLDL 18.4 08/29/2019 0839   LDLCALC 72 08/29/2019 0839   Hepatic Function Panel     Component Value Date/Time   PROT 7.1 08/29/2019 0839   ALBUMIN 4.4 08/29/2019 0839   AST 15 08/29/2019 0839   ALT 12 08/29/2019 0839   ALKPHOS 67 08/29/2019 0839   BILITOT 0.6 08/29/2019 0839   BILIDIR 0.1 04/14/2018 1024      Component Value Date/Time   TSH 2.33 08/29/2019 0839     Assessment and Plan:   Hypertension, unspecified type Never been on meds.  Will continue to monitor.    Generalized obesity  BMI 34.0-34.9,adult        Obesity Treatment / Action Plan:  Patient will work on garnering support from family  and friends to begin weight loss journey. Will work on eliminating or reducing the presence of highly palatable, calorie dense foods in the home. Will complete provided nutritional and psychosocial assessment questionnaire before the next appointment. Will be scheduled for indirect calorimetry to determine resting energy expenditure in a fasting state.  This will allow [place] to create a reduced calorie, high-protein meal plan to promote loss of fat mass while preserving muscle mass. Counseled on the health benefits of losing 5%-15% of total body weight. Was counseled on nutritional approaches to weight loss and benefits of reducing processed foods and consuming plant-based foods and high quality protein as part of nutritional weight management. Was counseled on pharmacotherapy and role as an adjunct in weight management.   Obesity Education Performed Today:  She was weighed on the bioimpedance scale and results were discussed and documented in the synopsis.  We discussed obesity as a disease and the importance of a more detailed evaluation of all the factors contributing to the disease.  We discussed the importance of long term lifestyle changes which include nutrition, exercise and behavioral modifications as well as the importance of customizing this to her specific health and social needs.  We discussed the benefits of reaching a healthier weight to alleviate the symptoms of existing conditions and reduce the risks of the biomechanical, metabolic and psychological effects of obesity.  Lisa Morris appears to be in the action stage of change and states they are ready to start intensive lifestyle modifications and behavioral modifications.  30 minutes was spent today on this visit including the above counseling, pre-visit chart review, and post-visit documentation.  Reviewed by clinician on day of visit: allergies, medications, problem list, medical history, surgical history, family history,  social history, and previous encounter notes pertinent to obesity diagnosis.    [name] On FNP-C

## 2023-09-02 NOTE — Progress Notes (Deleted)
Office: [phone]  /  Fax: [phone]  WEIGHT SUMMARY AND BIOMETRICS  Weight Lost Since Last Visit: na  Weight Gained Since Last Visit: na   Vitals Temp: 98.2 F (36.8 C) BP: (!) 142/84 Pulse Rate: 75 SpO2: 98 %   Anthropometric Measurements Height: 5\' 1"  (1.549 m) Weight: 180 lb (81.6 kg) BMI (Calculated): 34.03 Weight at Last Visit: na Weight Lost Since Last Visit: na Weight Gained Since Last Visit: na Starting Weight: na Peak Weight: 183lb   Body Composition  Body Fat %: 44.9 % Fat Mass (lbs): 81 lbs Muscle Mass (lbs): 94.4 lbs Total Body Water (lbs): 70.2 lbs Visceral Fat Rating : 14   Other Clinical Data Fasting: no Labs: no Comments: info session     HPI  Chief Complaint: OBESITY  Navid is here to discuss her progress with her obesity treatment plan. She is on the {MWMwtlossportion/plan2:23431} and states she is following her eating plan approximately *** % of the time. She states she is exercising *** minutes *** days per week.   Interval History:  Since last office visit she ***   Pharmacotherapy for weight loss: She {srtis (Optional):29129} currently taking {srtpreviousweightlossmeds (Optional):29124} for medical weight loss.  Denies side effects.    Previous pharmacotherapy for medical weight loss:  {srtpreviousweightlossmeds (Optional):29124}  She stopped *** due to side effects of ***.  Bariatric surgery:  Patient is status post {srtweightlosssurgery:29125} by Dr.  Marland Kitchen in ***.  Her highest weight prior to surgery was *** and her nadir weight after surgery was ***.  She is taking {srtvitamins (Optional):29126}.  She reports {srtrestriction (Optional):29127} restriction.     PHYSICAL EXAM:  Blood pressure (!) 142/84, pulse 75, temperature 98.2 F (36.8 C), height 5\' 1"  (1.549 m), weight 180 lb (81.6 kg), SpO2 98%. Body mass index is 34.01 kg/m.  General: She is overweight, cooperative, alert, well developed, and in no acute  distress. PSYCH: Has normal mood, affect and thought process.   Extremities: No edema.  Neurologic: No gross sensory or motor deficits. No tremors or fasciculations noted.    DIAGNOSTIC DATA REVIEWED:  BMET    Component Value Date/Time   NA 141 07/23/2022 0857   K 4.2 07/23/2022 0857   CL 110 07/23/2022 0857   CO2 25 07/23/2022 0857   GLUCOSE 91 07/23/2022 0857   BUN 17 07/23/2022 0857   CREATININE 0.82 07/23/2022 0857   CALCIUM 9.3 07/23/2022 0857   GFRNONAA >60 07/23/2022 0857   GFRAA >90 12/20/2012 0710   Lab Results  Component Value Date   HGBA1C 5.4 04/14/2018   No results found for: "INSULIN" Lab Results  Component Value Date   TSH 2.33 08/29/2019   CBC    Component Value Date/Time   WBC 5.9 07/23/2022 0857   RBC 4.78 07/23/2022 0857   HGB 14.0 07/23/2022 0857   HCT 42.8 07/23/2022 0857   PLT 188 07/23/2022 0857   MCV 89.5 07/23/2022 0857   MCH 29.3 07/23/2022 0857   MCHC 32.7 07/23/2022 0857   RDW 14.6 07/23/2022 0857   Iron Studies No results found for: "IRON", "TIBC", "FERRITIN", "IRONPCTSAT" Lipid Panel     Component Value Date/Time   CHOL 172 08/29/2019 0839   TRIG 92.0 08/29/2019 0839   HDL 81.60 08/29/2019 0839   CHOLHDL 2 08/29/2019 0839   VLDL 18.4 08/29/2019 0839   LDLCALC 72 08/29/2019 0839   Hepatic Function Panel     Component Value Date/Time   PROT 7.1 08/29/2019 0839   ALBUMIN  4.4 08/29/2019 0839   AST 15 08/29/2019 0839   ALT 12 08/29/2019 0839   ALKPHOS 67 08/29/2019 0839   BILITOT 0.6 08/29/2019 0839   BILIDIR 0.1 04/14/2018 1024      Component Value Date/Time   TSH 2.33 08/29/2019 0839   Nutritional No results found for: "VD25OH"   ASSESSMENT AND PLAN  TREATMENT PLAN FOR OBESITY:  Recommended Dietary Goals  Shonna is currently in the action stage of change. As such, her goal is to continue weight management plan. She has agreed to {MWMwtlossportion/plan2:23431}.  Behavioral Intervention  We discussed the  following Behavioral Modification Strategies today: {EMWMwtlossstrategies:28914::"increasing lean protein intake","decreasing simple carbohydrates ","increasing vegetables","increasing lower glycemic fruits","increasing water intake","continue to practice mindfulness when eating","planning for success"}.  Additional resources provided today: NA  Recommended Physical Activity Goals  Sandi has been advised to work up to 150 minutes of moderate intensity aerobic activity a week and strengthening exercises 2-3 times per week for cardiovascular health, weight loss maintenance and preservation of muscle mass.   She has agreed to {EMEXERCISE:28847::"Think about ways to increase daily physical activity and overcoming barriers to exercise"}   Pharmacotherapy We discussed various medication options to help Aryana with her weight loss efforts and we both agreed to ***.  ASSOCIATED CONDITIONS ADDRESSED TODAY  Action/Plan  There are no diagnoses linked to this encounter.       No follow-ups on file.Marland Kitchen She was informed of the importance of frequent follow up visits to maximize her success with intensive lifestyle modifications for her multiple health conditions.   ATTESTASTION STATEMENTS:  Reviewed by clinician on day of visit: allergies, medications, problem list, medical history, surgical history, family history, social history, and previous encounter notes.   Time spent on visit including pre-visit chart review and post-visit care and charting was *** minutes.    Theodis Sato. Krystal Delduca FNP-C

## 2023-09-07 DIAGNOSIS — M25612 Stiffness of left shoulder, not elsewhere classified: Secondary | ICD-10-CM | POA: Diagnosis not present

## 2023-09-07 DIAGNOSIS — M19012 Primary osteoarthritis, left shoulder: Secondary | ICD-10-CM | POA: Diagnosis not present

## 2023-09-07 DIAGNOSIS — M6281 Muscle weakness (generalized): Secondary | ICD-10-CM | POA: Diagnosis not present

## 2023-09-07 DIAGNOSIS — Z96612 Presence of left artificial shoulder joint: Secondary | ICD-10-CM | POA: Diagnosis not present

## 2023-09-09 DIAGNOSIS — M25612 Stiffness of left shoulder, not elsewhere classified: Secondary | ICD-10-CM | POA: Diagnosis not present

## 2023-09-09 DIAGNOSIS — Z96612 Presence of left artificial shoulder joint: Secondary | ICD-10-CM | POA: Diagnosis not present

## 2023-09-09 DIAGNOSIS — M19012 Primary osteoarthritis, left shoulder: Secondary | ICD-10-CM | POA: Diagnosis not present

## 2023-09-09 DIAGNOSIS — M6281 Muscle weakness (generalized): Secondary | ICD-10-CM | POA: Diagnosis not present

## 2023-09-13 ENCOUNTER — Ambulatory Visit: Payer: Medicare Other | Admitting: Bariatrics

## 2023-09-16 DIAGNOSIS — Z96612 Presence of left artificial shoulder joint: Secondary | ICD-10-CM | POA: Diagnosis not present

## 2023-09-16 DIAGNOSIS — M6281 Muscle weakness (generalized): Secondary | ICD-10-CM | POA: Diagnosis not present

## 2023-09-16 DIAGNOSIS — M19012 Primary osteoarthritis, left shoulder: Secondary | ICD-10-CM | POA: Diagnosis not present

## 2023-09-16 DIAGNOSIS — M25612 Stiffness of left shoulder, not elsewhere classified: Secondary | ICD-10-CM | POA: Diagnosis not present

## 2023-09-21 DIAGNOSIS — M6281 Muscle weakness (generalized): Secondary | ICD-10-CM | POA: Diagnosis not present

## 2023-09-21 DIAGNOSIS — Z96612 Presence of left artificial shoulder joint: Secondary | ICD-10-CM | POA: Diagnosis not present

## 2023-09-21 DIAGNOSIS — M25612 Stiffness of left shoulder, not elsewhere classified: Secondary | ICD-10-CM | POA: Diagnosis not present

## 2023-09-21 DIAGNOSIS — M19012 Primary osteoarthritis, left shoulder: Secondary | ICD-10-CM | POA: Diagnosis not present

## 2023-09-23 DIAGNOSIS — Z0000 Encounter for general adult medical examination without abnormal findings: Secondary | ICD-10-CM | POA: Diagnosis not present

## 2023-09-23 DIAGNOSIS — R7309 Other abnormal glucose: Secondary | ICD-10-CM | POA: Diagnosis not present

## 2023-09-23 DIAGNOSIS — E7800 Pure hypercholesterolemia, unspecified: Secondary | ICD-10-CM | POA: Diagnosis not present

## 2023-09-27 DIAGNOSIS — Z0000 Encounter for general adult medical examination without abnormal findings: Secondary | ICD-10-CM | POA: Diagnosis not present

## 2023-09-27 DIAGNOSIS — K209 Esophagitis, unspecified without bleeding: Secondary | ICD-10-CM | POA: Diagnosis not present

## 2023-09-27 DIAGNOSIS — G43101 Migraine with aura, not intractable, with status migrainosus: Secondary | ICD-10-CM | POA: Diagnosis not present

## 2023-09-27 DIAGNOSIS — Z23 Encounter for immunization: Secondary | ICD-10-CM | POA: Diagnosis not present

## 2023-09-27 DIAGNOSIS — L918 Other hypertrophic disorders of the skin: Secondary | ICD-10-CM | POA: Diagnosis not present

## 2023-09-28 ENCOUNTER — Ambulatory Visit: Payer: Medicare Other | Admitting: Bariatrics

## 2023-10-05 ENCOUNTER — Ambulatory Visit (INDEPENDENT_AMBULATORY_CARE_PROVIDER_SITE_OTHER): Payer: Medicare Other | Admitting: Bariatrics

## 2023-10-05 ENCOUNTER — Encounter: Payer: Self-pay | Admitting: Bariatrics

## 2023-10-05 VITALS — BP 146/81 | HR 79 | Temp 97.7°F | Ht 61.0 in | Wt 176.0 lb

## 2023-10-05 DIAGNOSIS — Z0000 Encounter for general adult medical examination without abnormal findings: Secondary | ICD-10-CM

## 2023-10-05 DIAGNOSIS — R0602 Shortness of breath: Secondary | ICD-10-CM | POA: Diagnosis not present

## 2023-10-05 DIAGNOSIS — Z6833 Body mass index (BMI) 33.0-33.9, adult: Secondary | ICD-10-CM

## 2023-10-05 DIAGNOSIS — E538 Deficiency of other specified B group vitamins: Secondary | ICD-10-CM | POA: Diagnosis not present

## 2023-10-05 DIAGNOSIS — Z1331 Encounter for screening for depression: Secondary | ICD-10-CM

## 2023-10-05 DIAGNOSIS — K589 Irritable bowel syndrome without diarrhea: Secondary | ICD-10-CM | POA: Diagnosis not present

## 2023-10-05 DIAGNOSIS — Z6379 Other stressful life events affecting family and household: Secondary | ICD-10-CM | POA: Diagnosis not present

## 2023-10-05 DIAGNOSIS — E7849 Other hyperlipidemia: Secondary | ICD-10-CM | POA: Diagnosis not present

## 2023-10-05 DIAGNOSIS — K219 Gastro-esophageal reflux disease without esophagitis: Secondary | ICD-10-CM

## 2023-10-05 DIAGNOSIS — E669 Obesity, unspecified: Secondary | ICD-10-CM

## 2023-10-05 DIAGNOSIS — R5383 Other fatigue: Secondary | ICD-10-CM

## 2023-10-05 DIAGNOSIS — R7309 Other abnormal glucose: Secondary | ICD-10-CM

## 2023-10-05 DIAGNOSIS — E559 Vitamin D deficiency, unspecified: Secondary | ICD-10-CM | POA: Diagnosis not present

## 2023-10-05 MED ORDER — DICYCLOMINE HCL 10 MG PO CAPS
10.0000 mg | ORAL_CAPSULE | Freq: Every day | ORAL | 0 refills | Status: DC
Start: 2023-10-05 — End: 2023-10-08

## 2023-10-06 LAB — INSULIN, RANDOM: INSULIN: 7.5 u[IU]/mL (ref 2.6–24.9)

## 2023-10-06 LAB — HEMOGLOBIN A1C
Est. average glucose Bld gHb Est-mCnc: 111 mg/dL
Hgb A1c MFr Bld: 5.5 % (ref 4.8–5.6)

## 2023-10-06 LAB — VITAMIN B12: Vitamin B-12: 307 pg/mL (ref 232–1245)

## 2023-10-06 LAB — VITAMIN D 25 HYDROXY (VIT D DEFICIENCY, FRACTURES): Vit D, 25-Hydroxy: 40.9 ng/mL (ref 30.0–100.0)

## 2023-10-06 NOTE — Progress Notes (Signed)
Chief Complaint:   OBESITY Lisa Morris (MR# [id number]) is a 72 y.o. female who presents for evaluation and treatment of obesity and related comorbidities. Current BMI is Body mass index is 33.25 kg/m. Lisa Morris has been struggling with her weight for many years and has been unsuccessful in either losing weight, maintaining weight loss, or reaching her healthy weight goal.  Lisa Morris is currently in the action stage of change and ready to dedicate time achieving and maintaining a healthier weight. Lisa Morris is interested in becoming our patient and working on intensive lifestyle modifications including (but not limited to) diet and exercise for weight loss.  Lisa Morris's habits were reviewed today and are as follows: Her family eats meals together, she thinks her family will eat healthier with her, her desired weight loss is 61 lbs, she started gaining excessive weight at age 27 (20 years ago), her heaviest weight ever was 180 pounds, she is a picky eater and doesn't like to eat healthier foods, she has significant food cravings issues, she skips meals frequently, she is frequently drinking liquids with calories, she frequently eats larger portions than normal, and she struggles with emotional eating.  Depression Screen Lisa Morris's Food and Mood (modified PHQ-9) score was 7.  Subjective:   1. Other fatigue Lisa Morris admits to daytime somnolence and admits to waking up still tired. Patient has a history of symptoms of daytime fatigue and morning fatigue. Lisa Morris generally gets 7 hours of sleep per night, and states that she has nightime awakenings and generally restful sleep. Snoring is present. Apneic episodes are present. Epworth Sleepiness Score is 14.   2. SOB (shortness of breath) on exertion Lisa Morris notes increasing shortness of breath with exercising and seems to be worsening over time with weight gain. She notes getting out of breath sooner with activity than she used to. This has not gotten worse recently. Lisa Morris  denies shortness of breath at rest or orthopnea.  3. Other hyperlipidemia Patient is taking atorvastatin.  Her HDL was 88 and LDL 78 on 09/20/2023.  4. Gastroesophageal reflux disease, unspecified whether esophagitis present Patient's symptoms is controlled with her medications.  She is taking Protonix.  5. 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  6. Elevated glucose Patient is not on medications currently.  7. Vitamin B 12 deficiency Patient is not on vitamin B12 supplementation.  8. Vitamin D deficiency Patient is not on vitamin D supplementation.  9. Irritable bowel syndrome, unspecified type Patient is taking Bentyl.   10. Health care maintenance Given obesity.   Assessment/Plan:   1. Other fatigue Lisa Morris does feel that her weight is causing her energy to be lower than it should be. Fatigue may be related to obesity, depression or many other causes. Labs will be ordered, and in the meanwhile, Lisa Morris will focus on self care including making healthy food choices, increasing physical activity and focusing on stress reduction.  - EKG 12-Lead  2. SOB (shortness of breath) on exertion Lisa Morris does feel that she gets out of breath more easily that she used to when she exercises. Lisa Morris's shortness of breath appears to be obesity related and exercise induced. She has agreed to work on weight loss and gradually increase exercise to treat her exercise induced shortness of breath. Will continue to monitor closely.  3. Other hyperlipidemia Patient will continue her medications.  4. Gastroesophageal reflux disease, unspecified whether esophagitis present Patient will continue her medications.  5. Stress due to  illness of family member We will follow over time.  6. Elevated glucose We will check labs today, and we will follow-up at patient's next visit.  - Hemoglobin A1c - Insulin, random  7. Vitamin B 12 deficiency We will check labs today, and  we will follow-up at patient's next visit.  - Vitamin B12  8. Vitamin D deficiency We will check labs today, and we will follow-up at patient's next visit.  - VITAMIN D 25 Hydroxy (Vit-D Deficiency, Fractures)  9. Irritable bowel syndrome, unspecified type Patient will continue Bentyl 10 mg nightly, and we will refill for 1 month.  Patient is to follow-up with her PCP.  - dicyclomine (BENTYL) 10 MG capsule; Take 1 capsule (10 mg total) by mouth at bedtime.  Dispense: 30 capsule; Refill: 0  10. Health care maintenance We will check labs today.  EKG and IC were done today and reviewed with the patient.  11. Depression screening Lisa Morris had a positive depression screening. Depression is commonly associated with obesity and often results in emotional eating behaviors. We will monitor this closely and work on CBT to help improve the non-hunger eating patterns. Referral to Psychology may be required if no improvement is seen as she continues in our clinic.  12. Obesity with starting BMI of 33.4 Lisa Morris is currently in the action stage of change and her goal is to continue with weight loss efforts. I recommend Lisa Morris begin the structured treatment plan as follows:  She has agreed to the Category 1 Plan, the Category 2 Plan, keeping a food journal and adhering to recommended goals of 1200 calories and 80 grams of protein, and the Pescatarian Plan.  Meal planning and intentional eating were discussed.  Review labs with the patient from 10/05/2023; UA, TSH, lipids, CMP, and CBC.  Protein shakes were discussed.  Exercise goals: No exercise has been prescribed at this time.   Behavioral modification strategies: increasing lean protein intake, decreasing simple carbohydrates, increasing vegetables, increasing water intake, decreasing eating out, no skipping meals, meal planning and cooking strategies, keeping healthy foods in the home, and planning for success.  She was informed of the importance of  frequent follow-up visits to maximize her success with intensive lifestyle modifications for her multiple health conditions. She was informed we would discuss her lab results at her next visit unless there is a critical issue that needs to be addressed sooner. Mariyam agreed to keep her next visit at the agreed upon time to discuss these results.  Objective:   Blood pressure (!) 146/81, pulse 79, temperature 97.7 F (36.5 C), height 5\' 1"  (1.549 m), weight 176 lb (79.8 kg), SpO2 98%. Body mass index is 33.25 kg/m.  EKG: Normal sinus rhythm, rate 76 BPM.  Indirect Calorimeter completed today shows a VO2 of 195 and a REE of 1339.  Her calculated basal metabolic rate is 1610 thus her basal metabolic rate is worse than expected.  General: Cooperative, alert, well developed, in no acute distress. HEENT: Conjunctivae and lids unremarkable. Cardiovascular: Regular rhythm.  Lungs: Normal work of breathing. Neurologic: No focal deficits.   Lab Results  Component Value Date   CREATININE 0.82 07/23/2022   BUN 17 07/23/2022   NA 141 07/23/2022   K 4.2 07/23/2022   CL 110 07/23/2022   CO2 25 07/23/2022   Lab Results  Component Value Date   ALT 12 08/29/2019   AST 15 08/29/2019   ALKPHOS 67 08/29/2019   BILITOT 0.6 08/29/2019   Lab Results  Component  Value Date   HGBA1C 5.5 10/05/2023   HGBA1C 5.4 04/14/2018   Lab Results  Component Value Date   INSULIN 7.5 10/05/2023   Lab Results  Component Value Date   TSH 2.33 08/29/2019   Lab Results  Component Value Date   CHOL 172 08/29/2019   HDL 81.60 08/29/2019   LDLCALC 72 08/29/2019   TRIG 92.0 08/29/2019   CHOLHDL 2 08/29/2019   Lab Results  Component Value Date   WBC 5.9 07/23/2022   HGB 14.0 07/23/2022   HCT 42.8 07/23/2022   MCV 89.5 07/23/2022   PLT 188 07/23/2022   No results found for: "IRON", "TIBC", "FERRITIN"  Attestation Statements:   Reviewed by clinician on day of visit: allergies, medications, problem list,  medical history, surgical history, family history, social history, and previous encounter notes.   Trude Mcburney, am acting as Energy manager for [employer], DO.  I have reviewed the above documentation for accuracy and completeness, and I agree with the above. Corinna Capra, DO

## 2023-10-08 ENCOUNTER — Telehealth: Payer: Self-pay | Admitting: Gastroenterology

## 2023-10-08 DIAGNOSIS — K589 Irritable bowel syndrome without diarrhea: Secondary | ICD-10-CM

## 2023-10-08 MED ORDER — PANTOPRAZOLE SODIUM 40 MG PO TBEC: 40.0000 mg | DELAYED_RELEASE_TABLET | Freq: Two times a day (BID) | ORAL | 3 refills | Status: DC

## 2023-10-08 MED ORDER — DICYCLOMINE HCL 10 MG PO CAPS: 10.0000 mg | ORAL_CAPSULE | Freq: Every day | ORAL | 3 refills | Status: DC

## 2023-10-08 NOTE — Telephone Encounter (Signed)
Returned patients call, sent refills to [facility]

## 2023-10-08 NOTE — Telephone Encounter (Signed)
Received MyChart message from patient requesting refills of Dicyclomine and Pantoprazole ASAP.  Thank you.

## 2023-10-19 ENCOUNTER — Encounter: Payer: Self-pay | Admitting: Bariatrics

## 2023-10-19 ENCOUNTER — Ambulatory Visit (INDEPENDENT_AMBULATORY_CARE_PROVIDER_SITE_OTHER): Payer: Medicare Other | Admitting: Bariatrics

## 2023-10-19 VITALS — BP 156/82 | HR 87 | Temp 97.9°F | Ht 61.0 in | Wt 176.0 lb

## 2023-10-19 DIAGNOSIS — E559 Vitamin D deficiency, unspecified: Secondary | ICD-10-CM

## 2023-10-19 DIAGNOSIS — E538 Deficiency of other specified B group vitamins: Secondary | ICD-10-CM | POA: Diagnosis not present

## 2023-10-19 DIAGNOSIS — E669 Obesity, unspecified: Secondary | ICD-10-CM

## 2023-10-19 DIAGNOSIS — Z6833 Body mass index (BMI) 33.0-33.9, adult: Secondary | ICD-10-CM | POA: Diagnosis not present

## 2023-10-19 NOTE — Progress Notes (Unsigned)
Chief Complaint:   OBESITY Lisa Morris is here to discuss her progress with her obesity treatment plan along with follow-up of her obesity related diagnoses. [name] is on the Category 1 Plan, the Category 2 Plan, keeping a food journal and adhering to recommended goals of 1200 calories and 80 grams of protein, and the Pescatarian Plan and states she is following her eating plan approximately 0% of the time. [name] states she is doing 0 minutes 0 times per week.  Today's visit was #: 2 Starting weight: 176 lbs Starting date: 10/05/2023 Today's weight: 176 lbs Today's date: 10/19/2023 Total lbs lost to date: 0 Total lbs lost since last in-office visit: 0  Interim History: Patient weight remains the same as her first visit.  She states that she does not usually go to the grocery store and she has been unable to get the food.  She struggles with the plan.  Subjective:   1. Vitamin B 12 deficiency Patient's B12 is currently low normal.  2. Vitamin D deficiency Patient's recent vitamin D level was 40.9.  Assessment/Plan:   1. Vitamin B 12 deficiency Patient will try a B12 supplementation at 1000 mcg daily.  2. Vitamin D deficiency Patient's Vitamin D goal is >50. She will begin OTC Vitamin D 2,000 IU daily.   3. Generalized obesity  4. BMI 33.0-33.9,adult Lisa Morris is currently in the action stage of change. As such, her goal is to continue with weight loss efforts. She has agreed to the Category 2 Plan and the Pescatarian Plan.   Meal planning was discussed.  Options for bread and carbohydrates were given.  Patient can change lunch for dinner.  Reviewed labs with the patient from 10/05/2023 vitamin D, B12, A1c, and insulin.  Exercise goals: No exercise has been prescribed at this time.  Behavioral modification strategies: increasing lean protein intake, decreasing simple carbohydrates, increasing vegetables, increasing water intake, decreasing eating out, no skipping meals, meal planning and  cooking strategies, keeping healthy foods in the home, and planning for success.  [name] has agreed to follow-up with our clinic in 2 weeks. She was informed of the importance of frequent follow-up visits to maximize her success with intensive lifestyle modifications for her multiple health conditions.   Objective:   Blood pressure (!) 156/82, pulse 87, temperature 97.9 F (36.6 C), height 5\' 1"  (1.549 m), weight 176 lb (79.8 kg), SpO2 97%. Body mass index is 33.25 kg/m.  General: Cooperative, alert, well developed, in no acute distress. HEENT: Conjunctivae and lids unremarkable. Cardiovascular: Regular rhythm.  Lungs: Normal work of breathing. Neurologic: No focal deficits.   Lab Results  Component Value Date   CREATININE 0.82 07/23/2022   BUN 17 07/23/2022   NA 141 07/23/2022   K 4.2 07/23/2022   CL 110 07/23/2022   CO2 25 07/23/2022   Lab Results  Component Value Date   ALT 12 08/29/2019   AST 15 08/29/2019   ALKPHOS 67 08/29/2019   BILITOT 0.6 08/29/2019   Lab Results  Component Value Date   HGBA1C 5.5 10/05/2023   HGBA1C 5.4 04/14/2018   Lab Results  Component Value Date   INSULIN 7.5 10/05/2023   Lab Results  Component Value Date   TSH 2.33 08/29/2019   Lab Results  Component Value Date   CHOL 172 08/29/2019   HDL 81.60 08/29/2019   LDLCALC 72 08/29/2019   TRIG 92.0 08/29/2019   CHOLHDL 2 08/29/2019   Lab Results  Component Value Date   VD25OH  40.9 10/05/2023   Lab Results  Component Value Date   WBC 5.9 07/23/2022   HGB 14.0 07/23/2022   HCT 42.8 07/23/2022   MCV 89.5 07/23/2022   PLT 188 07/23/2022   No results found for: "IRON", "TIBC", "FERRITIN"  Attestation Statements:   Reviewed by clinician on day of visit: allergies, medications, problem list, medical history, surgical history, family history, social history, and previous encounter notes.   Trude Mcburney, am acting as Energy manager for [employer], DO.  I have reviewed the  above documentation for accuracy and completeness, and I agree with the above. Corinna Capra, DO

## 2023-11-01 ENCOUNTER — Encounter: Payer: Self-pay | Admitting: Bariatrics

## 2023-11-01 ENCOUNTER — Ambulatory Visit (INDEPENDENT_AMBULATORY_CARE_PROVIDER_SITE_OTHER): Payer: Medicare Other | Admitting: Bariatrics

## 2023-11-01 VITALS — BP 138/66 | HR 83 | Temp 97.8°F | Ht 61.0 in | Wt 176.0 lb

## 2023-11-01 DIAGNOSIS — E669 Obesity, unspecified: Secondary | ICD-10-CM

## 2023-11-01 DIAGNOSIS — E66811 Obesity, class 1: Secondary | ICD-10-CM

## 2023-11-01 DIAGNOSIS — E785 Hyperlipidemia, unspecified: Secondary | ICD-10-CM | POA: Diagnosis not present

## 2023-11-01 DIAGNOSIS — Z6833 Body mass index (BMI) 33.0-33.9, adult: Secondary | ICD-10-CM

## 2023-11-01 DIAGNOSIS — E7849 Other hyperlipidemia: Secondary | ICD-10-CM

## 2023-11-01 NOTE — Progress Notes (Signed)
WEIGHT SUMMARY AND BIOMETRICS  Weight Lost Since Last Visit: 2lb  Weight Gained Since Last Visit: 0   Vitals Temp: 97.8 F (36.6 C) BP: 138/66 Pulse Rate: 83 SpO2: 97 %   Anthropometric Measurements Height: 5\' 1"  (1.549 m) Weight: 176 lb (79.8 kg) BMI (Calculated): 33.27 Weight at Last Visit: 176lb Weight Lost Since Last Visit: 2lb Weight Gained Since Last Visit: 0 Starting Weight: 176lb Total Weight Loss (lbs): 2 lb (0.907 kg)   Body Composition  Body Fat %: 43.3 % Fat Mass (lbs): 75.6 lbs Muscle Mass (lbs): 94.2 lbs Total Body Water (lbs): 68.2 lbs Visceral Fat Rating : 13   Other Clinical Data Fasting: no Labs: no Today's Visit #: 3 Starting Date: 10/05/23    OBESITY Lisa Morris is here to discuss her progress with her obesity treatment plan along with follow-up of her obesity related diagnoses.     Nutrition Plan: the Category 2 plan and the pescatarian plan - 90% adherence.  Current exercise: none  Interim History:  She is down 2 lbs since her last visit. She is journaling on a regular basis. It looks like she is getting adequate protein per her Lose It application.  Eating all of the food on the plan., Is not skipping meals, Journaling consistently., Meeting calorie goals., and Water intake is inadequate.  Hunger is moderately controlled.  Cravings are moderately controlled.  Assessment/Plan:   Hyperlipidemia LDL is at goal. Medication(s): Lipitor  Cardiovascular risk factors: advanced age (older than 68 for men, 52 for women), dyslipidemia, hypertension, obesity (BMI >= 30 kg/m2), and sedentary lifestyle  Lab Results  Component Value Date   CHOL 172 08/29/2019   HDL 81.60 08/29/2019   LDLCALC 72 08/29/2019   TRIG 92.0 08/29/2019   CHOLHDL 2 08/29/2019   Lab Results  Component Value Date   ALT 12 08/29/2019   AST 15 08/29/2019   ALKPHOS 67 08/29/2019   BILITOT 0.6 08/29/2019   The ASCVD Risk score (Arnett DK, et al., 2019)  failed to calculate for the following reasons:   Cannot find a previous HDL lab   Cannot find a previous total cholesterol lab  Plan:  Continue statin.  Information sheet on healthy vs unhealthy fats.  Will avoid all trans fats.  Will read labels Will minimize saturated fats except the following: low fat meats in moderation, diary, and limited dark chocolate.  Sheet for unhealthy and healthy fats.  Discussed high protein foods and handouts given.  Discussed the use of her Lose It application.  Reviewed her protein, calories, carbohydrates, and fats on her application.   Generalized Obesity: Current BMI BMI (Calculated): 33.27   Meghin is currently in the action stage of change. As such, her goal is to continue with weight loss efforts.  She has agreed to the Category 2 plan and practicing portion control and making smarter food choices, such as increasing vegetables and decreasing simple carbohydrates.  Exercise goals: Older adults should do exercises that maintain or improve balance if they are at risk of falling.   Behavioral modification strategies: increasing lean protein intake, meal planning , better snacking choices, avoiding temptations, and keep healthy foods in the home.  Meaghen has agreed to follow-up with our clinic in 2 weeks.     Objective:   VITALS: Per patient if applicable, see vitals. GENERAL: Alert and in no acute distress. CARDIOPULMONARY: No increased WOB. Speaking in clear sentences.  PSYCH: Pleasant and cooperative. Speech normal rate and rhythm. Affect is appropriate. Insight  and judgement are appropriate. Attention is focused, linear, and appropriate.  NEURO: Oriented as arrived to appointment on time with no prompting.   Attestation Statements:   Obesity Behavioral Intervention: Approximately 10 minutes were spent on the discussion below. ASK: We discussed the diagnosis of obesity with patient today and they agreed to give [place] permission to discuss obesity  behavioral modification therapy. ASSESS: Patient has the diagnosis of obesity and BMI today is 33. Patient is in the action stage of change. ADVISE: The patient was educated on the multiple health risks of obesity as well as the benefit of weight loss to improve health. Patient was advised of the need for long term treatment and the importance of lifestyle modifications to improve current health and to decrease risk of future health problems. AGREE: Multiple dietary modification options and treatment options were discussed, and the patient agreed to follow the recommendations documented in the above note. ARRANGE: The patient was educated on the importance of frequent visits to treat obesity as outlined per OMA, CMS, and USPSTF guidelines and agreed to schedule the next follow up appointment today   This was prepared with the assistance of Engineer, civil (consulting).  Occasional wrong-word or sound-a-like substitutions may have occurred due to the inherent limitations of voice recognition software.   Corinna Capra, DO

## 2023-11-15 ENCOUNTER — Ambulatory Visit: Payer: Medicare Other | Admitting: Bariatrics

## 2023-12-16 ENCOUNTER — Other Ambulatory Visit: Payer: Self-pay | Admitting: Gastroenterology

## 2024-02-02 DIAGNOSIS — M1712 Unilateral primary osteoarthritis, left knee: Secondary | ICD-10-CM | POA: Diagnosis not present

## 2024-04-21 DIAGNOSIS — H2513 Age-related nuclear cataract, bilateral: Secondary | ICD-10-CM | POA: Diagnosis not present

## 2024-04-21 DIAGNOSIS — H52203 Unspecified astigmatism, bilateral: Secondary | ICD-10-CM | POA: Diagnosis not present

## 2024-06-12 ENCOUNTER — Inpatient Hospital Stay (HOSPITAL_BASED_OUTPATIENT_CLINIC_OR_DEPARTMENT_OTHER)
Admission: RE | Admit: 2024-06-12 | Discharge: 2024-06-12 | Disposition: A | Discharge status: Home / Self Care | Source: Ambulatory Visit

## 2024-06-12 DIAGNOSIS — Z1231 Encounter for screening mammogram for malignant neoplasm of breast: Secondary | ICD-10-CM

## 2024-07-11 DIAGNOSIS — L308 Other specified dermatitis: Secondary | ICD-10-CM | POA: Diagnosis not present

## 2024-07-11 DIAGNOSIS — L218 Other seborrheic dermatitis: Secondary | ICD-10-CM | POA: Diagnosis not present

## 2024-08-07 DIAGNOSIS — M1712 Unilateral primary osteoarthritis, left knee: Secondary | ICD-10-CM | POA: Diagnosis not present

## 2024-08-10 ENCOUNTER — Other Ambulatory Visit: Payer: Self-pay | Admitting: Gastroenterology

## 2024-08-10 DIAGNOSIS — K589 Irritable bowel syndrome without diarrhea: Secondary | ICD-10-CM

## 2024-09-26 DIAGNOSIS — E7800 Pure hypercholesterolemia, unspecified: Secondary | ICD-10-CM | POA: Diagnosis not present

## 2024-09-28 ENCOUNTER — Other Ambulatory Visit: Payer: Self-pay | Admitting: Gastroenterology

## 2024-10-02 DIAGNOSIS — Z0000 Encounter for general adult medical examination without abnormal findings: Secondary | ICD-10-CM | POA: Diagnosis not present

## 2024-10-02 DIAGNOSIS — G43101 Migraine with aura, not intractable, with status migrainosus: Secondary | ICD-10-CM | POA: Diagnosis not present

## 2024-10-02 DIAGNOSIS — R0683 Snoring: Secondary | ICD-10-CM | POA: Diagnosis not present

## 2024-10-02 DIAGNOSIS — L219 Seborrheic dermatitis, unspecified: Secondary | ICD-10-CM | POA: Diagnosis not present

## 2024-10-02 DIAGNOSIS — E7800 Pure hypercholesterolemia, unspecified: Secondary | ICD-10-CM | POA: Diagnosis not present

## 2024-10-02 DIAGNOSIS — Z23 Encounter for immunization: Secondary | ICD-10-CM | POA: Diagnosis not present

## 2024-10-17 ENCOUNTER — Other Ambulatory Visit: Payer: Self-pay | Admitting: Gastroenterology

## 2024-10-17 DIAGNOSIS — M8589 Other specified disorders of bone density and structure, multiple sites: Secondary | ICD-10-CM | POA: Diagnosis not present

## 2024-10-17 DIAGNOSIS — K589 Irritable bowel syndrome without diarrhea: Secondary | ICD-10-CM

## 2024-10-25 ENCOUNTER — Telehealth: Payer: Self-pay | Admitting: Gastroenterology

## 2024-10-25 ENCOUNTER — Encounter: Payer: Self-pay | Admitting: *Deleted

## 2024-10-25 DIAGNOSIS — K589 Irritable bowel syndrome without diarrhea: Secondary | ICD-10-CM

## 2024-10-25 MED ORDER — DICYCLOMINE HCL 10 MG PO CAPS: 10.0000 mg | ORAL_CAPSULE | Freq: Every day | ORAL | 3 refills | Status: AC

## 2024-10-25 NOTE — Telephone Encounter (Signed)
 Medication sent will message patient on My Chart

## 2024-10-25 NOTE — Telephone Encounter (Signed)
 Inbound call from patient stating that she is needing a refill on her Dicyclomine . Patient is requesting a mychart message instead of been called back. please advise.
